# Patient Record
Sex: Male | Born: 1990 | Race: White | Hispanic: No | Marital: Single | Smoking: Current every day smoker
Health system: Southern US, Community
[De-identification: ages and names within clinical notes are randomized; demographics above are authoritative.]

## PROBLEM LIST (undated history)

## (undated) DIAGNOSIS — K279 Peptic ulcer, site unspecified, unspecified as acute or chronic, without hemorrhage or perforation: Secondary | ICD-10-CM

## (undated) DIAGNOSIS — K2970 Gastritis, unspecified, without bleeding: Secondary | ICD-10-CM

## (undated) HISTORY — PX: CHOLECYSTECTOMY: SHX55

---

## 2015-03-19 ENCOUNTER — Emergency Department (INDEPENDENT_AMBULATORY_CARE_PROVIDER_SITE_OTHER)
Admission: EM | Admit: 2015-03-19 | Discharge: 2015-03-19 | Disposition: A | Discharge status: Home / Self Care | Payer: Self-pay | Source: Home / Self Care | Attending: Emergency Medicine | Admitting: Emergency Medicine

## 2015-03-19 ENCOUNTER — Encounter (HOSPITAL_COMMUNITY): Payer: Self-pay | Admitting: Emergency Medicine

## 2015-03-19 DIAGNOSIS — H109 Unspecified conjunctivitis: Secondary | ICD-10-CM

## 2015-03-19 MED ORDER — POLYMYXIN B-TRIMETHOPRIM 10000-0.1 UNIT/ML-% OP SOLN: 1.0000 [drp] | OPHTHALMIC | Status: DC

## 2015-03-19 NOTE — ED Notes (Signed)
Patient c/o itchy eyes onset this morning. Patient reports that his daughter has had similar symptoms as well. Patient is in NAD.

## 2015-03-19 NOTE — Discharge Instructions (Signed)
Bacterial Conjunctivitis °Bacterial conjunctivitis (commonly called pink eye) is redness, soreness, or puffiness (inflammation) of the white part of your eye. It is caused by a germ called bacteria. These germs can easily spread from person to person (contagious). Your eye often will become red or pink. Your eye may also become irritated, watery, or have a thick discharge.  °HOME CARE  °· Apply a cool, clean washcloth over closed eyelids. Do this for 10-20 minutes, 3-4 times a day while you have pain. °· Gently wipe away any fluid coming from the eye with a warm, wet washcloth or cotton ball. °· Wash your hands often with soap and water. Use paper towels to dry your hands. °· Do not share towels or washcloths. °· Change or wash your pillowcase every day. °· Do not use eye makeup until the infection is gone. °· Do not use machines or drive if your vision is blurry. °· Stop using contact lenses. Do not use them again until your doctor says it is okay. °· Do not touch the tip of the eye drop bottle or medicine tube with your fingers when you put medicine on the eye. °GET HELP RIGHT AWAY IF:  °· Your eye is not better after 3 days of starting your medicine. °· You have a yellowish fluid coming out of the eye. °· You have more pain in the eye. °· Your eye redness is spreading. °· Your vision becomes blurry. °· You have a fever or lasting symptoms for more than 2-3 days. °· You have a fever and your symptoms suddenly get worse. °· You have pain in the face. °· Your face gets red or puffy (swollen). °MAKE SURE YOU:  °· Understand these instructions. °· Will watch this condition. °· Will get help right away if you are not doing well or get worse. °Document Released: 04/09/2008 Document Revised: 06/17/2012 Document Reviewed: 03/06/2012 °[employer]® Patient Information ©2015 [employer]. This information is not intended to replace advice given to you by your health care provider. Make sure you discuss any questions you have  with your health care provider. ° °

## 2015-03-19 NOTE — ED Provider Notes (Signed)
CSN: [id number]     Arrival date & time 03/19/15  1453 History   First MD Initiated Contact with Patient 03/19/15 1630     Chief Complaint  Patient presents with  . Eye Problem   (Consider location/radiation/quality/duration/timing/severity/associated sxs/prior Treatment) HPI  He is a 24 year old man here for evaluation of left eye pain. He states this started last night. He reports soreness around his left eye. He also reports increased watery drainage. No change in his vision. No fevers or chills. His 36-month-old daughter is also here with a eye issue. He denies any redness of the eye.  History reviewed. No pertinent past medical history. History reviewed. No pertinent past surgical history. No family history on file. Social History  Substance Use Topics  . Smoking status: Unknown If Ever Smoked  . Smokeless tobacco: None  . Alcohol Use: None    Review of Systems As in history of present illness Allergies  Review of patient's allergies indicates no known allergies.  Home Medications   Prior to Admission medications   Medication Sig Start Date End Date Taking? Authorizing Provider  trimethoprim-polymyxin b (POLYTRIM) ophthalmic solution Place 1 drop into the left eye every 4 (four) hours. For 5 days 03/19/15   Charm Rings, MD   Meds Ordered and Administered this Visit  Medications - No data to display  BP 115/67 mmHg  Temp(Src) 97.9 F (36.6 C) (Oral)  Resp 16  SpO2 97% No data found.   Physical Exam  Constitutional: He is oriented to person, place, and time. He appears well-developed and well-nourished. No distress.  Eyes: EOM are normal. Pupils are equal, round, and reactive to light.  Left conjunctiva is mildly erythematous. No swelling or edema of the eyelids.  Cardiovascular: Normal rate.   Pulmonary/Chest: Effort normal.  Neurological: He is alert and oriented to person, place, and time.    ED Course  Procedures (including critical care time)  Labs  Review Labs Reviewed - No data to display  Imaging Review No results found.   Visual Acuity Review  Right Eye Distance: 20/20 Left Eye Distance: 20/20 Bilateral Distance: 20/20    MDM   1. Conjunctivitis of left eye    We'll treat with Polytrim eyedrops. Follow-up as needed.    Charm Rings, MD 03/19/15 [phone]

## 2015-04-15 ENCOUNTER — Emergency Department (HOSPITAL_COMMUNITY)
Admission: EM | Admit: 2015-04-15 | Discharge: 2015-04-16 | Disposition: A | Discharge status: Home / Self Care | Payer: Self-pay | Attending: Emergency Medicine | Admitting: Emergency Medicine

## 2015-04-15 ENCOUNTER — Encounter (HOSPITAL_COMMUNITY): Payer: Self-pay | Admitting: Emergency Medicine

## 2015-04-15 DIAGNOSIS — R1013 Epigastric pain: Secondary | ICD-10-CM

## 2015-04-15 DIAGNOSIS — Z880 Allergy status to penicillin: Secondary | ICD-10-CM | POA: Insufficient documentation

## 2015-04-15 DIAGNOSIS — Z792 Long term (current) use of antibiotics: Secondary | ICD-10-CM | POA: Insufficient documentation

## 2015-04-15 DIAGNOSIS — Z9049 Acquired absence of other specified parts of digestive tract: Secondary | ICD-10-CM | POA: Insufficient documentation

## 2015-04-15 DIAGNOSIS — Y92238 Other place in hospital as the place of occurrence of the external cause: Secondary | ICD-10-CM | POA: Insufficient documentation

## 2015-04-15 DIAGNOSIS — S0990XA Unspecified injury of head, initial encounter: Secondary | ICD-10-CM

## 2015-04-15 DIAGNOSIS — Z8711 Personal history of peptic ulcer disease: Secondary | ICD-10-CM

## 2015-04-15 DIAGNOSIS — Z720 Tobacco use: Secondary | ICD-10-CM | POA: Insufficient documentation

## 2015-04-15 DIAGNOSIS — Y998 Other external cause status: Secondary | ICD-10-CM | POA: Insufficient documentation

## 2015-04-15 DIAGNOSIS — W2209XA Striking against other stationary object, initial encounter: Secondary | ICD-10-CM | POA: Insufficient documentation

## 2015-04-15 DIAGNOSIS — Y9389 Activity, other specified: Secondary | ICD-10-CM | POA: Insufficient documentation

## 2015-04-15 DIAGNOSIS — Z8719 Personal history of other diseases of the digestive system: Secondary | ICD-10-CM

## 2015-04-15 HISTORY — DX: Peptic ulcer, site unspecified, unspecified as acute or chronic, without hemorrhage or perforation: K27.9

## 2015-04-15 HISTORY — DX: Gastritis, unspecified, without bleeding: K29.70

## 2015-04-15 LAB — CBC WITH DIFFERENTIAL/PLATELET
Basophils Absolute: 0 10*3/uL (ref 0.0–0.1)
Basophils Relative: 0 %
EOS ABS: 0 10*3/uL (ref 0.0–0.7)
Eosinophils Relative: 0 %
HEMATOCRIT: 47.1 % (ref 39.0–52.0)
HEMOGLOBIN: 16.6 g/dL (ref 13.0–17.0)
LYMPHS ABS: 1.3 10*3/uL (ref 0.7–4.0)
Lymphocytes Relative: 9 %
MCH: 29.3 pg (ref 26.0–34.0)
MCHC: 35.2 g/dL (ref 30.0–36.0)
MCV: 83.2 fL (ref 78.0–100.0)
MONOS PCT: 7 %
Monocytes Absolute: 1 10*3/uL (ref 0.1–1.0)
NEUTROS PCT: 85 %
Neutro Abs: 12.7 10*3/uL — ABNORMAL HIGH (ref 1.7–7.7)
Platelets: 354 10*3/uL (ref 150–400)
RBC: 5.66 MIL/uL (ref 4.22–5.81)
RDW: 12.6 % (ref 11.5–15.5)
WBC: 15 10*3/uL — ABNORMAL HIGH (ref 4.0–10.5)

## 2015-04-15 MED ORDER — SODIUM CHLORIDE 0.9 % IV BOLUS (SEPSIS)
1000.0000 mL | Freq: Once | INTRAVENOUS | Status: AC
  Administered 2015-04-16: 1000 mL via INTRAVENOUS

## 2015-04-15 MED ORDER — GI COCKTAIL ~~LOC~~
30.0000 mL | Freq: Once | ORAL | Status: AC
Start: 2015-04-15 — End: 2015-04-16
  Administered 2015-04-16: 30 mL via ORAL
  Filled 2015-04-15: qty 30

## 2015-04-15 MED ORDER — PROMETHAZINE HCL 25 MG/ML IJ SOLN
12.5000 mg | Freq: Once | INTRAMUSCULAR | Status: AC
Start: 2015-04-15 — End: 2015-04-16
  Administered 2015-04-16: 12.5 mg via INTRAVENOUS
  Filled 2015-04-15: qty 1

## 2015-04-15 MED ORDER — PANTOPRAZOLE SODIUM 40 MG IV SOLR
40.0000 mg | Freq: Once | INTRAVENOUS | Status: AC
  Administered 2015-04-16: 40 mg via INTRAVENOUS
  Filled 2015-04-15: qty 40

## 2015-04-15 NOTE — ED Notes (Signed)
Pt placed in a gown and hooked up to the monitor with the BP cuff and pulse ox 

## 2015-04-15 NOTE — ED Notes (Signed)
Pt in EMS from home reporting epigastric pain, nausea, pt stated he passed out in shower and hit his head. Hx ulcers, smokes and ETOH use yesterday. Given 4 zofran en route

## 2015-04-15 NOTE — ED Provider Notes (Addendum)
CSN: [id number]     Arrival date & time 04/15/15  2300 History  By signing my name below, I, Terrance Branch, attest that this documentation has been prepared under the direction and in the presence of Geoffery Lyons, MD. Electronically Signed: Evon Slack, ED Scribe. 04/15/2015. 11:15 PM.    Chief Complaint  Patient presents with  . Abdominal Pain  . Nausea    Patient is a 24 y.o. male presenting with abdominal pain. The history is provided by the patient. No language interpreter was used.  Abdominal Pain Pain location:  Generalized Pain quality: stabbing   Duration:  2 days Chronicity:  Recurrent Associated symptoms: nausea and vomiting   Associated symptoms: no fever and no hematemesis    HPI Comments: Miguel Roman is a 24 y.o. male brought in by ambulance, who presents to the Emergency Department complaining of abdominal pain onset 2 days prior. Pt states that he has associated nausea and vomiting. Pt states that he usually finds relief with hot showers. Pt states that he has recently moved from [place], [place] and he forgot all his medications. Pt states that he was previously prescribed Phenergan, Protonix and benadryl. Per Ems pt has received Zofran PTA. Pt states that he has a Hx of peptic ulcers, gastritis and IBS. Pt states that the last time he has had a flare of symptoms was 5-6 month prior.     Past Medical History  Diagnosis Date  . Peptic ulcer   . Gastritis    Past Surgical History  Procedure Laterality Date  . Cholecystectomy     No family history on file. Social History  Substance Use Topics  . Smoking status: Current Every Day Smoker  . Smokeless tobacco: None  . Alcohol Use: Yes    Review of Systems  Constitutional: Negative for fever.  Gastrointestinal: Positive for nausea, vomiting and abdominal pain. Negative for hematemesis.  All other systems reviewed and are negative.     Allergies  Penicillins; Thorazine; Toradol; and Tramadol  Home  Medications   Prior to Admission medications   Medication Sig Start Date End Date Taking? Authorizing Provider  trimethoprim-polymyxin b (POLYTRIM) ophthalmic solution Place 1 drop into the left eye every 4 (four) hours. For 5 days 03/19/15   Charm Rings, MD   BP 154/92 mmHg  Pulse 106  Temp(Src) 98.2 F (36.8 C) (Oral)  Resp 18  SpO2 100%   Physical Exam  Constitutional: He is oriented to person, place, and time. He appears well-developed and well-nourished. No distress.  HENT:  Head: Normocephalic and atraumatic.  Eyes: Conjunctivae and EOM are normal.  Neck: Neck supple. No tracheal deviation present.  Cardiovascular: Normal rate, regular rhythm and normal heart sounds.   No murmur heard. Pulmonary/Chest: Effort normal and breath sounds normal. No respiratory distress. He has no wheezes. He has no rales.  Abdominal: There is tenderness. There is no rebound and no guarding.  TTP in epigastrium.   Musculoskeletal: Normal range of motion.  Neurological: He is alert and oriented to person, place, and time.  Skin: Skin is warm and dry.  Psychiatric: He has a normal mood and affect. His behavior is normal.  Nursing note and vitals reviewed.   ED Course  Procedures (including critical care time) DIAGNOSTIC STUDIES: Oxygen Saturation is 100% on RA, normal by my interpretation.    COORDINATION OF CARE: 11:14 PM-Discussed treatment plan with pt at bedside and pt agreed to plan.     Labs Review Labs Reviewed - No  data to display  Imaging Review No results found.    EKG Interpretation None      MDM   Final diagnoses:  None      Patient is a 24 year old male with stated history of gastric ulcers. He recently moved here from [place], [place]. He was treated there for this condition, and is out of his medications. He states that now he is experiencing additional abdominal pain. His workup today reveals no acute abnormality. He does have a mild elevation of  white count, however I am uncertain as to the significance of this. His CT scan is negative for acute process and he does not appear toxic.  He was given IV protonix, nausea medication, a GI cocktail, and I believe is appropriate for discharge. As he intends to be in the area for an extended period of time, I will give him the follow-up information for the on-call gastroenterology department with whom he can call to arrange an appointment to establish local care. I will prescribe Protonix and Phenergan.  He also complains of headache. He states that he passed out at home and hit his head in the shower. He is complaining of headache. He also reports hitting his head on the bed rail in the emergency department. A CT scan of his head was also obtained which was negative. He is neurologically intact.   I personally performed the services described in this documentation, which was scribed in my presence. The recorded information has been reviewed and is accurate.      Geoffery Lyons, MD 04/16/15 1610  Geoffery Lyons, MD 04/16/15 [id number]

## 2015-04-15 NOTE — ED Notes (Signed)
Pt called out, stated he "passed out" and "hit his head on the railing".  This RN went in to check on him, pt is anxious and states he is feeling numb all over and "this happens when I get like this".  This RN will update primary RN and provide pt with medications.

## 2015-04-16 ENCOUNTER — Encounter (HOSPITAL_COMMUNITY): Payer: Self-pay | Admitting: *Deleted

## 2015-04-16 ENCOUNTER — Emergency Department (HOSPITAL_COMMUNITY): Payer: Self-pay

## 2015-04-16 ENCOUNTER — Encounter (HOSPITAL_COMMUNITY): Payer: Self-pay | Admitting: Radiology

## 2015-04-16 ENCOUNTER — Emergency Department (HOSPITAL_COMMUNITY)
Admission: EM | Admit: 2015-04-16 | Discharge: 2015-04-16 | Disposition: A | Discharge status: Home / Self Care | Payer: Self-pay | Attending: Emergency Medicine | Admitting: Emergency Medicine

## 2015-04-16 DIAGNOSIS — R112 Nausea with vomiting, unspecified: Secondary | ICD-10-CM | POA: Insufficient documentation

## 2015-04-16 DIAGNOSIS — R1084 Generalized abdominal pain: Secondary | ICD-10-CM | POA: Insufficient documentation

## 2015-04-16 DIAGNOSIS — Z8719 Personal history of other diseases of the digestive system: Secondary | ICD-10-CM | POA: Insufficient documentation

## 2015-04-16 DIAGNOSIS — R109 Unspecified abdominal pain: Secondary | ICD-10-CM

## 2015-04-16 DIAGNOSIS — Z8711 Personal history of peptic ulcer disease: Secondary | ICD-10-CM | POA: Insufficient documentation

## 2015-04-16 DIAGNOSIS — Z720 Tobacco use: Secondary | ICD-10-CM | POA: Insufficient documentation

## 2015-04-16 DIAGNOSIS — Z880 Allergy status to penicillin: Secondary | ICD-10-CM | POA: Insufficient documentation

## 2015-04-16 LAB — CBC
HCT: 42.2 % (ref 39.0–52.0)
HEMOGLOBIN: 14.8 g/dL (ref 13.0–17.0)
MCH: 29.1 pg (ref 26.0–34.0)
MCHC: 35.1 g/dL (ref 30.0–36.0)
MCV: 83.1 fL (ref 78.0–100.0)
PLATELETS: 311 10*3/uL (ref 150–400)
RBC: 5.08 MIL/uL (ref 4.22–5.81)
RDW: 12.7 % (ref 11.5–15.5)
WBC: 12.3 10*3/uL — ABNORMAL HIGH (ref 4.0–10.5)

## 2015-04-16 LAB — COMPREHENSIVE METABOLIC PANEL
ALBUMIN: 5.1 g/dL — AB (ref 3.5–5.0)
ALBUMIN: 4.7 g/dL (ref 3.5–5.0)
ALK PHOS: 75 U/L (ref 38–126)
ALK PHOS: 59 U/L (ref 38–126)
ALT: 22 U/L (ref 17–63)
ALT: 21 U/L (ref 17–63)
ANION GAP: 11 (ref 5–15)
AST: 31 U/L (ref 15–41)
AST: 36 U/L (ref 15–41)
Anion gap: 15 (ref 5–15)
BILIRUBIN TOTAL: 0.9 mg/dL (ref 0.3–1.2)
BILIRUBIN TOTAL: 1.2 mg/dL (ref 0.3–1.2)
BUN: 25 mg/dL — AB (ref 6–20)
BUN: 21 mg/dL — AB (ref 6–20)
CALCIUM: 9 mg/dL (ref 8.9–10.3)
CO2: 20 mmol/L — ABNORMAL LOW (ref 22–32)
CO2: 21 mmol/L — ABNORMAL LOW (ref 22–32)
CREATININE: 0.98 mg/dL (ref 0.61–1.24)
Calcium: 10.2 mg/dL (ref 8.9–10.3)
Chloride: 106 mmol/L (ref 101–111)
Chloride: 103 mmol/L (ref 101–111)
Creatinine, Ser: 1.43 mg/dL — ABNORMAL HIGH (ref 0.61–1.24)
GFR calc Af Amer: >60 mL/min (ref >60)
GFR calc Af Amer: >60 mL/min (ref >60)
GFR calc non Af Amer: >60 mL/min (ref >60)
GFR calc non Af Amer: >60 mL/min (ref >60)
GLUCOSE: 156 mg/dL — AB (ref 65–99)
GLUCOSE: 114 mg/dL — AB (ref 65–99)
POTASSIUM: 3.9 mmol/L (ref 3.5–5.1)
Potassium: 3.4 mmol/L — ABNORMAL LOW (ref 3.5–5.1)
Sodium: 141 mmol/L (ref 135–145)
Sodium: 135 mmol/L (ref 135–145)
TOTAL PROTEIN: 8.9 g/dL — AB (ref 6.5–8.1)
TOTAL PROTEIN: 8 g/dL (ref 6.5–8.1)

## 2015-04-16 LAB — URINALYSIS, ROUTINE W REFLEX MICROSCOPIC
Glucose, UA: NEGATIVE mg/dL
HGB URINE DIPSTICK: NEGATIVE
Ketones, ur: >80 mg/dL — AB
Leukocytes, UA: NEGATIVE
Nitrite: NEGATIVE
PROTEIN: 30 mg/dL — AB
Specific Gravity, Urine: 1.038 — ABNORMAL HIGH (ref 1.005–1.030)
UROBILINOGEN UA: 1 mg/dL (ref 0.0–1.0)
pH: 6.5 (ref 5.0–8.0)

## 2015-04-16 LAB — LIPASE, BLOOD
Lipase: 15 U/L — ABNORMAL LOW (ref 22–51)
Lipase: 19 U/L — ABNORMAL LOW (ref 22–51)

## 2015-04-16 LAB — URINE MICROSCOPIC-ADD ON

## 2015-04-16 MED ORDER — PROMETHAZINE HCL 25 MG PO TABS: 25.0000 mg | ORAL_TABLET | Freq: Four times a day (QID) | ORAL | Status: DC | PRN

## 2015-04-16 MED ORDER — SODIUM CHLORIDE 0.9 % IV SOLN
1000.0000 mL | Freq: Once | INTRAVENOUS | Status: AC
  Administered 2015-04-16: 1000 mL via INTRAVENOUS

## 2015-04-16 MED ORDER — GI COCKTAIL ~~LOC~~
30.0000 mL | Freq: Once | ORAL | Status: AC
  Administered 2015-04-16: 30 mL via ORAL
  Filled 2015-04-16: qty 30

## 2015-04-16 MED ORDER — IOHEXOL 300 MG/ML  SOLN
100.0000 mL | Freq: Once | INTRAMUSCULAR | Status: AC | PRN
  Administered 2015-04-16: 100 mL via INTRAVENOUS

## 2015-04-16 MED ORDER — PANTOPRAZOLE SODIUM 20 MG PO TBEC: 20.0000 mg | DELAYED_RELEASE_TABLET | Freq: Every day | ORAL | Status: DC

## 2015-04-16 MED ORDER — PROMETHAZINE HCL 25 MG/ML IJ SOLN
25.0000 mg | Freq: Once | INTRAMUSCULAR | Status: AC
Start: 2015-04-16 — End: 2015-04-16
  Administered 2015-04-16: 25 mg via INTRAVENOUS
  Filled 2015-04-16: qty 1

## 2015-04-16 NOTE — ED Notes (Signed)
Pt to CT

## 2015-04-16 NOTE — Discharge Instructions (Signed)

## 2015-04-16 NOTE — ED Notes (Signed)
Pt states "I just moved up here about a month a half ago.  I went to [facility] yesterday and they gave me protonix and zofran Rx's but I didn't have the money to get them filled.  I get paid today.  I work at [employer]."

## 2015-04-16 NOTE — Discharge Instructions (Signed)
Protonix as prescribed.  Phenergan as prescribed as needed for nausea.  Call [facility] gastroenterology to arrange a follow-up appointment. The contact information for [facility] has been provided in this discharge summary.   Abdominal Pain Many things can cause abdominal pain. Usually, abdominal pain is not caused by a disease and will improve without treatment. It can often be observed and treated at home. Your health care provider will do a physical exam and possibly order blood tests and X-rays to help determine the seriousness of your pain. However, in many cases, more time must pass before a clear cause of the pain can be found. Before that point, your health care provider may not know if you need more testing or further treatment. HOME CARE INSTRUCTIONS  Monitor your abdominal pain for any changes. The following actions may help to alleviate any discomfort you are experiencing:  Only take over-the-counter or prescription medicines as directed by your health care provider.  Do not take laxatives unless directed to do so by your health care provider.  Try a clear liquid diet (broth, tea, or water) as directed by your health care provider. Slowly move to a bland diet as tolerated. SEEK MEDICAL CARE IF:  You have unexplained abdominal pain.  You have abdominal pain associated with nausea or diarrhea.  You have pain when you urinate or have a bowel movement.  You experience abdominal pain that wakes you in the night.  You have abdominal pain that is worsened or improved by eating food.  You have abdominal pain that is worsened with eating fatty foods.  You have a fever. SEEK IMMEDIATE MEDICAL CARE IF:   Your pain does not go away within 2 hours.  You keep throwing up (vomiting).  Your pain is felt only in portions of the abdomen, such as the right side or the left lower portion of the abdomen.  You pass bloody or black tarry stools. MAKE SURE YOU:  Understand these  instructions.   Will watch your condition.   Will get help right away if you are not doing well or get worse.  Document Released: 04/10/2005 Document Revised: 07/06/2013 Document Reviewed: 03/10/2013 [facility] Patient Information 2015 [place], [place]. This information is not intended to replace advice given to you by your health care provider. Make sure you discuss any questions you have with your health care provider.  Concussion A concussion, or closed-head injury, is a brain injury caused by a direct blow to the head or by a quick and sudden movement (jolt) of the head or neck. Concussions are usually not life-threatening. Even so, the effects of a concussion can be serious. If you have had a concussion before, you are more likely to experience concussion-like symptoms after a direct blow to the head.  CAUSES  Direct blow to the head, such as from running into another player during a soccer game, being hit in a fight, or hitting your head on a hard surface.  A jolt of the head or neck that causes the brain to move back and forth inside the skull, such as in a car crash. SIGNS AND SYMPTOMS The signs of a concussion can be hard to notice. Early on, they may be missed by you, family members, and health care providers. You may look fine but act or feel differently. Symptoms are usually temporary, but they may last for days, weeks, or even longer. Some symptoms may appear right away while others may not show up for hours or days. Every head  injury is different. Symptoms include:  Mild to moderate headaches that will not go away.  A feeling of pressure inside your head.  Having more trouble than usual:  Learning or remembering things you have heard.  Answering questions.  Paying attention or concentrating.  Organizing daily tasks.  Making decisions and solving problems.  Slowness in thinking, acting or reacting, speaking, or reading.  Getting lost or being easily  confused.  Feeling tired all the time or lacking energy (fatigued).  Feeling drowsy.  Sleep disturbances.  Sleeping more than usual.  Sleeping less than usual.  Trouble falling asleep.  Trouble sleeping (insomnia).  Loss of balance or feeling lightheaded or dizzy.  Nausea or vomiting.  Numbness or tingling.  Increased sensitivity to:  Sounds.  Lights.  Distractions.  Vision problems or eyes that tire easily.  Diminished sense of taste or smell.  Ringing in the ears.  Mood changes such as feeling sad or anxious.  Becoming easily irritated or angry for little or no reason.  Lack of motivation.  Seeing or hearing things other people do not see or hear (hallucinations). DIAGNOSIS Your health care provider can usually diagnose a concussion based on a description of your injury and symptoms. He or she will ask whether you passed out (lost consciousness) and whether you are having trouble remembering events that happened right before and during your injury. Your evaluation might include:  A brain scan to look for signs of injury to the brain. Even if the test shows no injury, you may still have a concussion.  Blood tests to be sure other problems are not present. TREATMENT  Concussions are usually treated in an emergency department, in urgent care, or at a clinic. You may need to stay in the hospital overnight for further treatment.  Tell your health care provider if you are taking any medicines, including prescription medicines, over-the-counter medicines, and natural remedies. Some medicines, such as blood thinners (anticoagulants) and aspirin, may increase the chance of complications. Also tell your health care provider whether you have had alcohol or are taking illegal drugs. This information may affect treatment.  Your health care provider will send you home with important instructions to follow.  How fast you will recover from a concussion depends on many  factors. These factors include how severe your concussion is, what part of your brain was injured, your age, and how healthy you were before the concussion.  Most people with mild injuries recover fully. Recovery can take time. In general, recovery is slower in older persons. Also, persons who have had a concussion in the past or have other medical problems may find that it takes longer to recover from their current injury. HOME CARE INSTRUCTIONS General Instructions  Carefully follow the directions your health care provider gave you.  Only take over-the-counter or prescription medicines for pain, discomfort, or fever as directed by your health care provider.  Take only those medicines that your health care provider has approved.  Do not drink alcohol until your health care provider says you are well enough to do so. Alcohol and certain other drugs may slow your recovery and can put you at risk of further injury.  If it is harder than usual to remember things, write them down.  If you are easily distracted, try to do one thing at a time. For example, do not try to watch TV while fixing dinner.  Talk with family members or close friends when making important decisions.  Keep all follow-up  appointments. Repeated evaluation of your symptoms is recommended for your recovery.  Watch your symptoms and tell others to do the same. Complications sometimes occur after a concussion. Older adults with a brain injury may have a higher risk of serious complications, such as a blood clot on the brain.  Tell your teachers, school nurse, school counselor, coach, athletic trainer, or work Production designer, theatre/television/film about your injury, symptoms, and restrictions. Tell them about what you can or cannot do. They should watch for:  Increased problems with attention or concentration.  Increased difficulty remembering or learning new information.  Increased time needed to complete tasks or assignments.  Increased irritability  or decreased ability to cope with stress.  Increased symptoms.  Rest. Rest helps the brain to heal. Make sure you:  Get plenty of sleep at night. Avoid staying up late at night.  Keep the same bedtime hours on weekends and weekdays.  Rest during the day. Take daytime naps or rest breaks when you feel tired.  Limit activities that require a lot of thought or concentration. These include:  Doing homework or job-related work.  Watching TV.  Working on the computer.  Avoid any situation where there is potential for another head injury (football, hockey, soccer, basketball, martial arts, downhill snow sports and horseback riding). Your condition will get worse every time you experience a concussion. You should avoid these activities until you are evaluated by the appropriate follow-up health care providers. Returning To Your Regular Activities You will need to return to your normal activities slowly, not all at once. You must give your body and brain enough time for recovery.  Do not return to sports or other athletic activities until your health care provider tells you it is safe to do so.  Ask your health care provider when you can drive, ride a bicycle, or operate heavy machinery. Your ability to react may be slower after a brain injury. Never do these activities if you are dizzy.  Ask your health care provider about when you can return to work or school. Preventing Another Concussion It is very important to avoid another brain injury, especially before you have recovered. In rare cases, another injury can lead to permanent brain damage, brain swelling, or death. The risk of this is greatest during the first 7-10 days after a head injury. Avoid injuries by:  Wearing a seat belt when riding in a car.  Drinking alcohol only in moderation.  Wearing a helmet when biking, skiing, skateboarding, skating, or doing similar activities.  Avoiding activities that could lead to a second  concussion, such as contact or recreational sports, until your health care provider says it is okay.  Taking safety measures in your home.  Remove clutter and tripping hazards from floors and stairways.  Use grab bars in bathrooms and handrails by stairs.  Place non-slip mats on floors and in bathtubs.  Improve lighting in dim areas. SEEK MEDICAL CARE IF:  You have increased problems paying attention or concentrating.  You have increased difficulty remembering or learning new information.  You need more time to complete tasks or assignments than before.  You have increased irritability or decreased ability to cope with stress.  You have more symptoms than before. Seek medical care if you have any of the following symptoms for more than 2 weeks after your injury:  Lasting (chronic) headaches.  Dizziness or balance problems.  Nausea.  Vision problems.  Increased sensitivity to noise or light.  Depression or mood swings.  Anxiety  or irritability.  Memory problems.  Difficulty concentrating or paying attention.  Sleep problems.  Feeling tired all the time. SEEK IMMEDIATE MEDICAL CARE IF:  You have severe or worsening headaches. These may be a sign of a blood clot in the brain.  You have weakness (even if only in one hand, leg, or part of the face).  You have numbness.  You have decreased coordination.  You vomit repeatedly.  You have increased sleepiness.  One pupil is larger than the other.  You have convulsions.  You have slurred speech.  You have increased confusion. This may be a sign of a blood clot in the brain.  You have increased restlessness, agitation, or irritability.  You are unable to recognize people or places.  You have neck pain.  It is difficult to wake you up.  You have unusual behavior changes.  You lose consciousness. MAKE SURE YOU:  Understand these instructions.  Will watch your condition.  Will get help right away  if you are not doing well or get worse. Document Released: 09/21/2003 Document Revised: 07/06/2013 Document Reviewed: 01/21/2013 [facility] Patient Information 2015 [place], [place]. This information is not intended to replace advice given to you by your health care provider. Make sure you discuss any questions you have with your health care provider.

## 2015-04-16 NOTE — ED Notes (Signed)
Pt still unable to void

## 2015-04-16 NOTE — ED Notes (Signed)
Pt unable to void at this time. 

## 2015-04-16 NOTE — ED Notes (Signed)
Pt escorted to lobby.  Able to walk independently.  Given water and shown to the bathroom.

## 2015-04-16 NOTE — ED Provider Notes (Signed)
CSN: [id number]     Arrival date & time 04/16/15  1337 History   First MD Initiated Contact with Patient 04/16/15 1436     Chief Complaint  Patient presents with  . Abdominal Pain  . Nausea   HPI Patient has a history of recurrent abdominal pain near the bowel syndrome. Patient states he started having a recurrent episode of abdominal pain and vomiting over the last few days. The pain is sharp and stabbing in his upper abdomen. He has not been able to eat or drink much because of the pain and the vomiting. Patient went to [facility] emergency room yesterday. He was evaluated in the emergency room with laboratory tests as well as a CT scan. Tests showed a slight increase in his white blood cell count but otherwise was unremarkable. Patient was treated and released. He was given prescriptions. Patient returns to the emergency room today because he still feeling poorly. He did not get the prescriptions filled.  No fevers.  No diarrhea. Past Medical History  Diagnosis Date  . Peptic ulcer   . Gastritis    Past Surgical History  Procedure Laterality Date  . Cholecystectomy     No family history on file. Social History  Substance Use Topics  . Smoking status: Current Every Day Smoker -- 1.00 packs/day  . Smokeless tobacco: None  . Alcohol Use: Yes     Comment: Pt states "I ain't drunk in 2 weeks.  I don't drink every day."    Review of Systems    Allergies  Haldol; Thorazine; Toradol; Tramadol; Trazodone and nefazodone; and Penicillins  Home Medications   Prior to Admission medications   Medication Sig Start Date End Date Taking? Authorizing Provider  pantoprazole (PROTONIX) 20 MG tablet Take 1 tablet (20 mg total) by mouth daily. 04/16/15  Yes Geoffery Lyons, MD  promethazine (PHENERGAN) 25 MG tablet Take 1 tablet (25 mg total) by mouth every 6 (six) hours as needed for nausea. 04/16/15  Yes Geoffery Lyons, MD   BP 153/93 mmHg  Pulse 88  Temp(Src) 98.5 F (36.9 C) (Oral)  Resp 18   SpO2 100% Physical Exam  Constitutional: He appears distressed.  HENT:  Head: Normocephalic and atraumatic.  Right Ear: External ear normal.  Left Ear: External ear normal.  Eyes: Conjunctivae are normal. Right eye exhibits no discharge. Left eye exhibits no discharge. No scleral icterus.  Neck: Neck supple. No tracheal deviation present.  Cardiovascular: Normal rate, regular rhythm and intact distal pulses.   Pulmonary/Chest: Effort normal and breath sounds normal. No stridor. No respiratory distress. He has no wheezes. He has no rales.  Abdominal: Soft. Bowel sounds are normal. He exhibits no distension. There is generalized tenderness. There is no rebound and no guarding.  Musculoskeletal: He exhibits no edema or tenderness.  Neurological: He is alert. He has normal strength. No cranial nerve deficit (no facial droop, extraocular movements intact, no slurred speech) or sensory deficit. He exhibits normal muscle tone. He displays no seizure activity. Coordination normal.  Skin: Skin is warm and dry. No rash noted. He is not diaphoretic.  Psychiatric: He has a normal mood and affect.  Nursing note and vitals reviewed.   ED Course  Procedures (including critical care time) Labs Review Labs Reviewed  CBC - Abnormal; Notable for the following:    WBC 12.3 (*)    All other components within normal limits  LIPASE, BLOOD  COMPREHENSIVE METABOLIC PANEL  URINALYSIS, ROUTINE W REFLEX MICROSCOPIC (NOT AT [facility])  Imaging Review Ct Head Wo Contrast  04/16/2015   CLINICAL DATA:  Syncope with head injury on bathtub. Initial encounter.  EXAM: CT HEAD WITHOUT CONTRAST  TECHNIQUE: Contiguous axial images were obtained from the base of the skull through the vertex without intravenous contrast.  COMPARISON:  None.  FINDINGS: Skull and Sinuses:Remote appearing nasal arch fractures with depression.  Scar or laceration in the left posterior scalp near the vertex. No underlying fracture.  Adenoid  hypertrophy appearance.  Orbits: No acute abnormality.  Brain: Normal. No evidence of acute infarction, hemorrhage, hydrocephalus, or mass lesion/mass effect.  IMPRESSION: 1. Normal intracranial imaging. 2. Remote appearing depressed nasal arch fractures.   Electronically Signed   By: Marnee Spring M.D.   On: 04/16/2015 00:44   Ct Abdomen Pelvis W Contrast  04/16/2015   CLINICAL DATA:  Acute onset of mid abdominal pain, nausea, vomiting and diarrhea. Initial encounter.  EXAM: CT ABDOMEN AND PELVIS WITH CONTRAST  TECHNIQUE: Multidetector CT imaging of the abdomen and pelvis was performed using the standard protocol following bolus administration of intravenous contrast.  CONTRAST:  OMNIPAQUE IOHEXOL 300 MG/ML  SOLN  COMPARISON:  None.  FINDINGS: The visualized lung bases are clear.  The liver and spleen are unremarkable in appearance. The patient is status post cholecystectomy, with clips noted at the gallbladder fossa. The pancreas and adrenal glands are unremarkable.  The kidneys are unremarkable in appearance. There is no evidence of hydronephrosis. No renal or ureteral stones are seen. No perinephric stranding is appreciated.  No free fluid is identified. The small bowel is unremarkable in appearance. The stomach is within normal limits. No acute vascular abnormalities are seen.  The appendix is normal in caliber, without evidence of appendicitis. The colon is unremarkable in appearance.  The bladder is mildly distended and grossly unremarkable. Mild calcification is noted along a small urachal remnant, without a focal mass. The prostate remains normal in size. No inguinal lymphadenopathy is seen.  No acute osseous abnormalities are identified.  IMPRESSION: Unremarkable contrast-enhanced CT of the abdomen and pelvis.   Electronically Signed   By: Roanna Raider M.D.   On: 04/16/2015 00:45   I have personally reviewed and evaluated these images and lab results as part of my medical  decision-making.  Medications  0.9 %  sodium chloride infusion (0 mLs Intravenous Stopped 04/16/15 1617)  gi cocktail (Maalox,Lidocaine,Donnatal) (30 mLs Oral Given 04/16/15 1502)  promethazine (PHENERGAN) injection 25 mg (25 mg Intravenous Given 04/16/15 1456)     MDM   Will give iv fluids and antinausea medications.  Recheck labs.  CT scan yesterday did not show any acute process.  Pt states he does have a history of recurrent abdominal pain with IBS and ulcers.   Linwood Dibbles, MD 04/16/15 [phone]

## 2015-04-17 ENCOUNTER — Emergency Department (HOSPITAL_COMMUNITY)
Admission: EM | Admit: 2015-04-17 | Discharge: 2015-04-17 | Discharge status: Against Medical Advice | Payer: Self-pay | Attending: Emergency Medicine | Admitting: Emergency Medicine

## 2015-04-17 ENCOUNTER — Emergency Department (HOSPITAL_COMMUNITY): Payer: Self-pay

## 2015-04-17 ENCOUNTER — Emergency Department (HOSPITAL_COMMUNITY)
Admission: EM | Admit: 2015-04-17 | Discharge: 2015-04-17 | Disposition: A | Discharge status: Home / Self Care | Payer: Self-pay | Attending: Emergency Medicine | Admitting: Emergency Medicine

## 2015-04-17 ENCOUNTER — Encounter (HOSPITAL_COMMUNITY): Payer: Self-pay | Admitting: Emergency Medicine

## 2015-04-17 DIAGNOSIS — Z8719 Personal history of other diseases of the digestive system: Secondary | ICD-10-CM | POA: Insufficient documentation

## 2015-04-17 DIAGNOSIS — R112 Nausea with vomiting, unspecified: Secondary | ICD-10-CM | POA: Insufficient documentation

## 2015-04-17 DIAGNOSIS — R1013 Epigastric pain: Secondary | ICD-10-CM | POA: Insufficient documentation

## 2015-04-17 DIAGNOSIS — R1030 Lower abdominal pain, unspecified: Secondary | ICD-10-CM | POA: Insufficient documentation

## 2015-04-17 DIAGNOSIS — Z720 Tobacco use: Secondary | ICD-10-CM | POA: Insufficient documentation

## 2015-04-17 DIAGNOSIS — Z880 Allergy status to penicillin: Secondary | ICD-10-CM | POA: Insufficient documentation

## 2015-04-17 DIAGNOSIS — Z9049 Acquired absence of other specified parts of digestive tract: Secondary | ICD-10-CM | POA: Insufficient documentation

## 2015-04-17 DIAGNOSIS — Z79899 Other long term (current) drug therapy: Secondary | ICD-10-CM | POA: Insufficient documentation

## 2015-04-17 DIAGNOSIS — Z8711 Personal history of peptic ulcer disease: Secondary | ICD-10-CM | POA: Insufficient documentation

## 2015-04-17 DIAGNOSIS — R1012 Left upper quadrant pain: Secondary | ICD-10-CM | POA: Insufficient documentation

## 2015-04-17 LAB — CBC WITH DIFFERENTIAL/PLATELET
BASOS ABS: 0 10*3/uL (ref 0.0–0.1)
BASOS PCT: 0 %
EOS PCT: 0 %
Eosinophils Absolute: 0 10*3/uL (ref 0.0–0.7)
HEMATOCRIT: 41.2 % (ref 39.0–52.0)
Hemoglobin: 14.4 g/dL (ref 13.0–17.0)
LYMPHS PCT: 7 %
Lymphs Abs: 0.9 10*3/uL (ref 0.7–4.0)
MCH: 29.3 pg (ref 26.0–34.0)
MCHC: 35 g/dL (ref 30.0–36.0)
MCV: 83.7 fL (ref 78.0–100.0)
Monocytes Absolute: 0.4 10*3/uL (ref 0.1–1.0)
Monocytes Relative: 3 %
NEUTROS ABS: 11.8 10*3/uL — AB (ref 1.7–7.7)
Neutrophils Relative %: 90 %
PLATELETS: 301 10*3/uL (ref 150–400)
RBC: 4.92 MIL/uL (ref 4.22–5.81)
RDW: 12.6 % (ref 11.5–15.5)
WBC: 13.1 10*3/uL — AB (ref 4.0–10.5)

## 2015-04-17 LAB — COMPREHENSIVE METABOLIC PANEL
ALT: 24 U/L (ref 17–63)
ANION GAP: 9 (ref 5–15)
AST: 51 U/L — ABNORMAL HIGH (ref 15–41)
Albumin: 4.5 g/dL (ref 3.5–5.0)
Alkaline Phosphatase: 63 U/L (ref 38–126)
BUN: 20 mg/dL (ref 6–20)
CHLORIDE: 106 mmol/L (ref 101–111)
CO2: 23 mmol/L (ref 22–32)
Calcium: 8.9 mg/dL (ref 8.9–10.3)
Creatinine, Ser: 0.99 mg/dL (ref 0.61–1.24)
Glucose, Bld: 111 mg/dL — ABNORMAL HIGH (ref 65–99)
POTASSIUM: 3.3 mmol/L — AB (ref 3.5–5.1)
Sodium: 138 mmol/L (ref 135–145)
TOTAL PROTEIN: 8 g/dL (ref 6.5–8.1)
Total Bilirubin: 0.9 mg/dL (ref 0.3–1.2)

## 2015-04-17 LAB — LIPASE, BLOOD: LIPASE: 24 U/L (ref 22–51)

## 2015-04-17 LAB — URINALYSIS, ROUTINE W REFLEX MICROSCOPIC
Bilirubin Urine: NEGATIVE
Glucose, UA: NEGATIVE mg/dL
Hgb urine dipstick: NEGATIVE
Ketones, ur: >80 mg/dL — AB
LEUKOCYTES UA: NEGATIVE
Nitrite: NEGATIVE
PROTEIN: NEGATIVE mg/dL
Specific Gravity, Urine: 1.03 (ref 1.005–1.030)
UROBILINOGEN UA: 1 mg/dL (ref 0.0–1.0)
pH: 7 (ref 5.0–8.0)

## 2015-04-17 MED ORDER — PROMETHAZINE HCL 25 MG/ML IJ SOLN
25.0000 mg | Freq: Once | INTRAMUSCULAR | Status: AC
  Administered 2015-04-17: 25 mg via INTRAVENOUS
  Filled 2015-04-17: qty 1

## 2015-04-17 MED ORDER — METOCLOPRAMIDE HCL 5 MG/ML IJ SOLN: 10.0000 mg | Freq: Once | INTRAMUSCULAR | Status: DC

## 2015-04-17 MED ORDER — PROMETHAZINE HCL 25 MG RE SUPP: 25.0000 mg | Freq: Four times a day (QID) | RECTAL | Status: DC | PRN

## 2015-04-17 MED ORDER — METOCLOPRAMIDE HCL 5 MG/ML IJ SOLN
10.0000 mg | Freq: Once | INTRAMUSCULAR | Status: AC
  Administered 2015-04-17: 10 mg via INTRAVENOUS
  Filled 2015-04-17: qty 2

## 2015-04-17 MED ORDER — SODIUM CHLORIDE 0.9 % IV BOLUS (SEPSIS)
1000.0000 mL | Freq: Once | INTRAVENOUS | Status: AC
  Administered 2015-04-17: 1000 mL via INTRAVENOUS

## 2015-04-17 MED ORDER — FAMOTIDINE IN NACL 20-0.9 MG/50ML-% IV SOLN
20.0000 mg | INTRAVENOUS | Status: AC
  Administered 2015-04-17: 20 mg via INTRAVENOUS
  Filled 2015-04-17: qty 50

## 2015-04-17 MED ORDER — PROMETHAZINE HCL 25 MG PO TABS: 25.0000 mg | ORAL_TABLET | Freq: Four times a day (QID) | ORAL | Status: DC | PRN

## 2015-04-17 MED ORDER — GI COCKTAIL ~~LOC~~
30.0000 mL | Freq: Once | ORAL | Status: AC
  Administered 2015-04-17: 30 mL via ORAL
  Filled 2015-04-17: qty 30

## 2015-04-17 MED ORDER — ONDANSETRON HCL 4 MG/2ML IJ SOLN
4.0000 mg | Freq: Once | INTRAMUSCULAR | Status: AC
  Administered 2015-04-17: 4 mg via INTRAVENOUS
  Filled 2015-04-17: qty 2

## 2015-04-17 MED ORDER — PANTOPRAZOLE SODIUM 40 MG IV SOLR
40.0000 mg | INTRAVENOUS | Status: AC
  Administered 2015-04-17: 40 mg via INTRAVENOUS
  Filled 2015-04-17: qty 40

## 2015-04-17 MED ORDER — FAMOTIDINE 20 MG PO TABS: 20.0000 mg | ORAL_TABLET | Freq: Two times a day (BID) | ORAL | Status: DC

## 2015-04-17 NOTE — ED Notes (Signed)
Pt walking around stating that he was "going to pass out" to "get someone's attention".  Pt assisted back to chair.  Complaining that he is not in a regular bed.

## 2015-04-17 NOTE — ED Notes (Signed)
Per EMS: Pt states he went to [facility] on Friday.  Was prescribed phenergan and protonix but has not gotten them filled.  Was also here yesterday for same.  NV and gen abd pain.

## 2015-04-17 NOTE — ED Notes (Signed)
Pt given urinal.

## 2015-04-17 NOTE — ED Notes (Signed)
Pt's mother contacted this Consulting civil engineer, due to being upset with the Pt's care.  Pt's mother was informed that the Pt had an [place] and lab work completed and has been given several medications.  No results were provided.  Pt's mother questioned admission status and the possibility of an EGD.  She was informed that the Pt would have to meet certain criteria for admission and EGDs are only performed on an emergent basis.  She was not happy with this information and stated "I have the CEO for the whole hospital's number, but I don't want to make that call.  He is pain and they are treating him badly.  That's not how you treat people."  This Charge RN apologized that he is in pain and feels that he is being treated badly.  Pt's mother offered [place] voicemail, which she accepted.

## 2015-04-17 NOTE — Discharge Instructions (Signed)

## 2015-04-17 NOTE — ED Notes (Signed)
Pt reports wife was on the phone and wanting to talk to nurse, "wife" said "why are ya'll not treating him and treating his pain?" rn explained that we are treating the pt, wife interrupted me and said "ya'll are not and if I have to call the CEO I will", rn stated that she was not going to continue talking to wife if she was going to threaten nurse, wife started talking in louder voice and nurse just gave phone back to pt.   rn gave pt another urinal. Pt asking "when he can get some more pain medicine".

## 2015-04-17 NOTE — ED Notes (Signed)
Pt stated that zofran "makes him nauseas", rn explained to pt that zofran is for nausea, pt keeps asking where he phenergan is, rn explained it must come from pharmacy.   Pt writhing in bed, pt stopped writhing in bed 30 seconds after phenergan was given. Pt states "in [place] I would stay in the hospital for 6 days with abdominal pain".

## 2015-04-17 NOTE — ED Notes (Signed)
rn went into room to admister medication, pt said "my mom is crazy, my girlfriend called my mom and my mom is going to call the CEO".   Pt said "my girl friend wants to know why I couldn't have visitors". rn explained that visitors asked to come back when pt was getting [place], and reminded pt that rn told him that his girlfriend had relayed the message that she was leaving and to call her when he was leaving.   Pt said in [place] " I would be admitted for this and get protonix, diluadid or morphine, and phenergan". rn explained that pt had received protonix and phenergan.

## 2015-04-17 NOTE — ED Notes (Signed)
[place] at bedside, registration asked if visitors could come back, rn said no because [place] was in the room. Registration relayed that girlfriend wanted pt to know she was leaving and to let her know when he was leaving. This was relayed to pt, [place] tech was witness.

## 2015-04-17 NOTE — ED Notes (Signed)
Per EMS: Pt just left AMA from [facility].  Called 911 from [employer].  Pt attempting to hang off the side of the stretcher upon riding to triage.  Pt is A&O x 4, moaning.  20 g in lt AC.

## 2015-04-17 NOTE — ED Notes (Signed)
Pt given applesauce to eat per md, pt concerned applesauce may upset his stomach or make him vomit. rn told pt to let her know if he vomits.

## 2015-04-17 NOTE — ED Notes (Signed)
rn went to check on pt, pt had eaten applesauce, rn asked if pt had vomited, pt said "no I'm holding it down". Pt asking for nausea meds, rn explain she had just given pt reglan for nausea.

## 2015-04-17 NOTE — ED Provider Notes (Signed)
CSN: [id number]     Arrival date & time 04/17/15  1329 History   First MD Initiated Contact with Patient 04/17/15 1542     Chief Complaint  Patient presents with  . Nausea  . Emesis     (Consider location/radiation/quality/duration/timing/severity/associated sxs/prior Treatment) HPI Comments: The patient is a 24 year old male, he has recently moved here within the last month, he states that he has a history of irritable bowel syndrome, he has had appendicitis, he has been diagnosed with peptic ulcer disease by endoscopy, he denies taking any chronic medications and states that sometimes this flares up and causes vomiting, abdominal pain, he has been having 3 days of persistent symptoms and does not seem to be improving, had lab work and a CT scan which were all fairly unremarkable except for a slight leukocytosis. He states that he is not had any relief of his symptoms and has no medication a by medicines for this. He states this is similar to prior flareups.  Patient is a 24 y.o. male presenting with vomiting. The history is provided by the patient.  Emesis   Past Medical History  Diagnosis Date  . Peptic ulcer   . Gastritis    Past Surgical History  Procedure Laterality Date  . Cholecystectomy     No family history on file. Social History  Substance Use Topics  . Smoking status: Current Every Day Smoker -- 1.00 packs/day  . Smokeless tobacco: None  . Alcohol Use: Yes     Comment: Pt states "I ain't drunk in 2 weeks.  I don't drink every day."    Review of Systems  Gastrointestinal: Positive for vomiting.  All other systems reviewed and are negative.     Allergies  Haldol; Thorazine; Toradol; Tramadol; Trazodone and nefazodone; and Penicillins  Home Medications   Prior to Admission medications   Medication Sig Start Date End Date Taking? Authorizing Provider  famotidine (PEPCID) 20 MG tablet Take 1 tablet (20 mg total) by mouth 2 (two) times daily. 04/17/15   Eber Hong, MD  pantoprazole (PROTONIX) 20 MG tablet Take 1 tablet (20 mg total) by mouth daily. 04/16/15   Geoffery Lyons, MD  promethazine (PHENERGAN) 25 MG suppository Place 1 suppository (25 mg total) rectally every 6 (six) hours as needed for nausea or vomiting. 04/17/15   Eber Hong, MD  promethazine (PHENERGAN) 25 MG tablet Take 1 tablet (25 mg total) by mouth every 6 (six) hours as needed for nausea. 04/16/15   Geoffery Lyons, MD   BP 153/94 mmHg  Pulse 75  Temp(Src) 98.8 F (37.1 C) (Oral)  Resp 17  SpO2 100% Physical Exam  Constitutional: He appears well-developed and well-nourished. He appears distressed (appears Uncomfortable and colicky).  HENT:  Head: Normocephalic and atraumatic.  Mouth/Throat: Oropharynx is clear and moist. No oropharyngeal exudate.  No blood in the mouth, moist mucous membranes  Eyes: Conjunctivae and EOM are normal. Pupils are equal, round, and reactive to light. Right eye exhibits no discharge. Left eye exhibits no discharge. No scleral icterus.  No signs of Jaundice  Neck: Normal range of motion. Neck supple. No JVD present. No thyromegaly present.  Cardiovascular: Normal rate, regular rhythm, normal heart sounds and intact distal pulses.  Exam reveals no gallop and no friction rub.   No murmur heard. Pulmonary/Chest: Effort normal and breath sounds normal. No respiratory distress. He has no wheezes. He has no rales.  Abdominal: Soft. Bowel sounds are normal. He exhibits no distension and no mass. There  is tenderness ( tenderness across the lower abdomen and left upper quadrant, minimal epigastric pain, no peritoneal signs or guarding, no CVA tenderness ).  Musculoskeletal: Normal range of motion. He exhibits no edema or tenderness.  Lymphadenopathy:    He has no cervical adenopathy.  Neurological: He is alert. Coordination normal.  Skin: Skin is warm and dry. No rash noted. No erythema.  Psychiatric: He has a normal mood and affect. His behavior is normal.   Nursing note and vitals reviewed.   ED Course  Procedures (including critical care time) Labs Review Labs Reviewed  COMPREHENSIVE METABOLIC PANEL - Abnormal; Notable for the following:    Potassium 3.3 (*)    Glucose, Bld 111 (*)    AST 51 (*)    All other components within normal limits  URINALYSIS, ROUTINE W REFLEX MICROSCOPIC (NOT AT [facility]) - Abnormal; Notable for the following:    Ketones, ur >80 (*)    All other components within normal limits  CBC WITH DIFFERENTIAL/PLATELET - Abnormal; Notable for the following:    WBC 13.1 (*)    Neutro Abs 11.8 (*)    All other components within normal limits  LIPASE, BLOOD    Imaging Review Ct Head Wo Contrast  04/16/2015   CLINICAL DATA:  Syncope with head injury on bathtub. Initial encounter.  EXAM: CT HEAD WITHOUT CONTRAST  TECHNIQUE: Contiguous axial images were obtained from the base of the skull through the vertex without intravenous contrast.  COMPARISON:  None.  FINDINGS: Skull and Sinuses:Remote appearing nasal arch fractures with depression.  Scar or laceration in the left posterior scalp near the vertex. No underlying fracture.  Adenoid hypertrophy appearance.  Orbits: No acute abnormality.  Brain: Normal. No evidence of acute infarction, hemorrhage, hydrocephalus, or mass lesion/mass effect.  IMPRESSION: 1. Normal intracranial imaging. 2. Remote appearing depressed nasal arch fractures.   Electronically Signed   By: Marnee Spring M.D.   On: 04/16/2015 00:44   US Abdomen Complete  04/17/2015   CLINICAL DATA:  Abdominal pain, nausea and vomiting for 4 days. Prior cholecystectomy.  EXAM: ULTRASOUND ABDOMEN COMPLETE  COMPARISON:  CT abdomen/pelvis from 04/16/2015.  FINDINGS: Gallbladder: Surgically absent .  Common bile duct: Diameter: 2 mm  Liver: No focal lesion identified. Within normal limits in parenchymal echogenicity.  IVC: No abnormality visualized.  Pancreas: Visualized portion unremarkable.  Spleen: Size and appearance within  normal limits.  Right Kidney: Length: 11.3 cm. Echogenicity within normal limits. No mass or hydronephrosis visualized.  Left Kidney: Length: 10.5 cm. Echogenicity within normal limits. No mass or hydronephrosis visualized.  Abdominal aorta: No aneurysm visualized.  Other findings: Patent main portal vein with appropriate flow direction.  IMPRESSION: Normal abdominal sonogram status post cholecystectomy, with no biliary ductal dilatation.   Electronically Signed   By: Delbert Phenix M.D.   On: 04/17/2015 16:45   Ct Abdomen Pelvis W Contrast  04/16/2015   CLINICAL DATA:  Acute onset of mid abdominal pain, nausea, vomiting and diarrhea. Initial encounter.  EXAM: CT ABDOMEN AND PELVIS WITH CONTRAST  TECHNIQUE: Multidetector CT imaging of the abdomen and pelvis was performed using the standard protocol following bolus administration of intravenous contrast.  CONTRAST:  OMNIPAQUE IOHEXOL 300 MG/ML  SOLN  COMPARISON:  None.  FINDINGS: The visualized lung bases are clear.  The liver and spleen are unremarkable in appearance. The patient is status post cholecystectomy, with clips noted at the gallbladder fossa. The pancreas and adrenal glands are unremarkable.  The kidneys are unremarkable  in appearance. There is no evidence of hydronephrosis. No renal or ureteral stones are seen. No perinephric stranding is appreciated.  No free fluid is identified. The small bowel is unremarkable in appearance. The stomach is within normal limits. No acute vascular abnormalities are seen.  The appendix is normal in caliber, without evidence of appendicitis. The colon is unremarkable in appearance.  The bladder is mildly distended and grossly unremarkable. Mild calcification is noted along a small urachal remnant, without a focal mass. The prostate remains normal in size. No inguinal lymphadenopathy is seen.  No acute osseous abnormalities are identified.  IMPRESSION: Unremarkable contrast-enhanced CT of the abdomen and pelvis.    Electronically Signed   By: Roanna Raider M.D.   On: 04/16/2015 00:45   I have personally reviewed and evaluated these images and lab results as part of my medical decision-making.    MDM   Final diagnoses:  Non-intractable vomiting with nausea, vomiting of unspecified type  Epigastric pain    The patient has had a voluminous amount of emesis, he has abdominal tenderness which is not surgical or pathological however would be consistent with worsening peptic ulcer disease, lab work was reviewed over the last 2 days on his back-to-back visits and there was no significant findings other than a leukocytosis. CT scan also reviewed showing no signs of acute surgical pathology. We'll be proactive and treating stomach ulcers with GI cocktail, Pepcid, Protonix, IV hydration, antibiotics, recheck labs.  The patient continues to be on the phone with either his girlfriend or his mother, they have been contentious with the nurse, stating that he needs to be admitted to the hospital. The patient tells me that he has improved, he is willing to try a GI cocktail in to be discharged home. He states that the reason he does not want to do this is because he does not have money for medications. I informed him that he would need to follow-up with a family doctor, discussed with family members who may be able to help him financially and that I would give him rectal suppositories for nausea as well as a prescription for Pepcid which would be cheaper than a proton pump inhibitor. He is agreeable to this plan. He has tolerated oral fluids and applesauce.  Meds given in ED:  Medications  gi cocktail (Maalox,Lidocaine,Donnatal) (not administered)  sodium chloride 0.9 % bolus 1,000 mL (0 mLs Intravenous Stopped 04/17/15 1754)  sodium chloride 0.9 % bolus 1,000 mL (0 mLs Intravenous Stopped 04/17/15 1754)  promethazine (PHENERGAN) injection 25 mg (25 mg Intravenous Given 04/17/15 1621)  ondansetron (ZOFRAN) injection 4 mg  (4 mg Intravenous Given 04/17/15 1610)  pantoprazole (PROTONIX) injection 40 mg (40 mg Intravenous Given 04/17/15 1610)  famotidine (PEPCID) IVPB 20 mg premix (0 mg Intravenous Stopped 04/17/15 1700)  gi cocktail (Maalox,Lidocaine,Donnatal) (30 mLs Oral Given 04/17/15 1605)  metoCLOPramide (REGLAN) injection 10 mg (10 mg Intravenous Given 04/17/15 1812)    New Prescriptions   FAMOTIDINE (PEPCID) 20 MG TABLET    Take 1 tablet (20 mg total) by mouth 2 (two) times daily.   PROMETHAZINE (PHENERGAN) 25 MG SUPPOSITORY    Place 1 suppository (25 mg total) rectally every 6 (six) hours as needed for nausea or vomiting.      Eber Hong, MD 04/17/15 [phone]

## 2015-04-17 NOTE — ED Notes (Signed)
md at bedside

## 2015-04-17 NOTE — ED Notes (Signed)
Rn and md went on [employer], both phenergan and pepcid are on the $4 list. rn explained this to pt, because pt said that prescriptions from previous ED visits he didn't get filled because he could not afford them.  rn told pt to go get his prescriptions filled and that not being able to afford meds was not an excuse because medications were on the $4 list.   Pt requesting a bus pass. Bus pass provided.

## 2015-04-17 NOTE — ED Notes (Signed)
Pt requesting to leave.

## 2015-04-18 ENCOUNTER — Encounter (HOSPITAL_COMMUNITY): Payer: Self-pay | Admitting: Emergency Medicine

## 2015-04-18 ENCOUNTER — Emergency Department (HOSPITAL_COMMUNITY)
Admission: EM | Admit: 2015-04-18 | Discharge: 2015-04-18 | Disposition: A | Discharge status: Home / Self Care | Payer: Self-pay | Attending: Emergency Medicine | Admitting: Emergency Medicine

## 2015-04-18 DIAGNOSIS — F1410 Cocaine abuse, uncomplicated: Secondary | ICD-10-CM | POA: Insufficient documentation

## 2015-04-18 DIAGNOSIS — Z720 Tobacco use: Secondary | ICD-10-CM | POA: Insufficient documentation

## 2015-04-18 DIAGNOSIS — K279 Peptic ulcer, site unspecified, unspecified as acute or chronic, without hemorrhage or perforation: Secondary | ICD-10-CM | POA: Insufficient documentation

## 2015-04-18 DIAGNOSIS — F1310 Sedative, hypnotic or anxiolytic abuse, uncomplicated: Secondary | ICD-10-CM | POA: Insufficient documentation

## 2015-04-18 DIAGNOSIS — F1210 Cannabis abuse, uncomplicated: Secondary | ICD-10-CM | POA: Insufficient documentation

## 2015-04-18 DIAGNOSIS — R1115 Cyclical vomiting syndrome unrelated to migraine: Secondary | ICD-10-CM

## 2015-04-18 LAB — CBC WITH DIFFERENTIAL/PLATELET
BASOS ABS: 0 10*3/uL (ref 0.0–0.1)
BASOS PCT: 0 %
Eosinophils Absolute: 0 10*3/uL (ref 0.0–0.7)
Eosinophils Relative: 0 %
HEMATOCRIT: 40.1 % (ref 39.0–52.0)
HEMOGLOBIN: 14 g/dL (ref 13.0–17.0)
Lymphocytes Relative: 17 %
Lymphs Abs: 1.8 10*3/uL (ref 0.7–4.0)
MCH: 29.1 pg (ref 26.0–34.0)
MCHC: 34.9 g/dL (ref 30.0–36.0)
MCV: 83.4 fL (ref 78.0–100.0)
Monocytes Absolute: 0.9 10*3/uL (ref 0.1–1.0)
Monocytes Relative: 9 %
NEUTROS ABS: 8 10*3/uL — AB (ref 1.7–7.7)
NEUTROS PCT: 74 %
Platelets: 291 10*3/uL (ref 150–400)
RBC: 4.81 MIL/uL (ref 4.22–5.81)
RDW: 12.6 % (ref 11.5–15.5)
WBC: 10.7 10*3/uL — AB (ref 4.0–10.5)

## 2015-04-18 LAB — BASIC METABOLIC PANEL
ANION GAP: 11 (ref 5–15)
BUN: 15 mg/dL (ref 6–20)
CALCIUM: 8.8 mg/dL — AB (ref 8.9–10.3)
CO2: 23 mmol/L (ref 22–32)
Chloride: 105 mmol/L (ref 101–111)
Creatinine, Ser: 1.05 mg/dL (ref 0.61–1.24)
GFR calc non Af Amer: >60 mL/min (ref >60)
Glucose, Bld: 83 mg/dL (ref 65–99)
Potassium: 3.1 mmol/L — ABNORMAL LOW (ref 3.5–5.1)
Sodium: 139 mmol/L (ref 135–145)

## 2015-04-18 LAB — URINALYSIS, ROUTINE W REFLEX MICROSCOPIC
BILIRUBIN URINE: NEGATIVE
Glucose, UA: NEGATIVE mg/dL
HGB URINE DIPSTICK: NEGATIVE
Leukocytes, UA: NEGATIVE
Nitrite: NEGATIVE
PROTEIN: NEGATIVE mg/dL
Specific Gravity, Urine: 1.021 (ref 1.005–1.030)
UROBILINOGEN UA: 0.2 mg/dL (ref 0.0–1.0)
pH: 7 (ref 5.0–8.0)

## 2015-04-18 LAB — RAPID URINE DRUG SCREEN, HOSP PERFORMED
AMPHETAMINES: NOT DETECTED
Barbiturates: POSITIVE — AB
Benzodiazepines: NOT DETECTED
Cocaine: POSITIVE — AB
OPIATES: NOT DETECTED
Tetrahydrocannabinol: POSITIVE — AB

## 2015-04-18 LAB — LIPASE, BLOOD: Lipase: 20 U/L — ABNORMAL LOW (ref 22–51)

## 2015-04-18 MED ORDER — HYDROMORPHONE HCL 1 MG/ML IJ SOLN
1.0000 mg | Freq: Once | INTRAMUSCULAR | Status: AC
  Administered 2015-04-18: 1 mg via INTRAVENOUS
  Filled 2015-04-18: qty 1

## 2015-04-18 MED ORDER — ONDANSETRON HCL 4 MG/2ML IJ SOLN
4.0000 mg | Freq: Once | INTRAMUSCULAR | Status: AC
  Administered 2015-04-18: 4 mg via INTRAVENOUS
  Filled 2015-04-18: qty 2

## 2015-04-18 MED ORDER — PANTOPRAZOLE SODIUM 40 MG IV SOLR
40.0000 mg | Freq: Once | INTRAVENOUS | Status: AC
  Administered 2015-04-18: 40 mg via INTRAVENOUS
  Filled 2015-04-18: qty 40

## 2015-04-18 MED ORDER — PROMETHAZINE HCL 25 MG/ML IJ SOLN
25.0000 mg | Freq: Once | INTRAMUSCULAR | Status: AC
  Administered 2015-04-18: 25 mg via INTRAVENOUS
  Filled 2015-04-18: qty 1

## 2015-04-18 MED ORDER — SODIUM CHLORIDE 0.9 % IV BOLUS (SEPSIS)
1000.0000 mL | Freq: Once | INTRAVENOUS | Status: AC
  Administered 2015-04-18: 1000 mL via INTRAVENOUS

## 2015-04-18 NOTE — ED Notes (Signed)
Pt states that he feels more nauseous when he drinks water, but feels better when he stops.

## 2015-04-18 NOTE — ED Notes (Signed)
Pt. Given water for PO challenge. 

## 2015-04-18 NOTE — ED Notes (Signed)
Pt given water for a fluid challenge. Pt states that he feels better, only slightly nauseous.

## 2015-04-18 NOTE — ED Notes (Signed)
Pt given urinal and instructed to provide urine sample; pt verbalized understanding 

## 2015-04-18 NOTE — ED Notes (Signed)
Patient is resting comfortably. 

## 2015-04-18 NOTE — ED Notes (Signed)
Medical records authorization forms sent/received.

## 2015-04-18 NOTE — ED Notes (Signed)
Pt. reports persistent nausea and vomitting with generalized abdominal pain for several days seen at [facility] ER this evening blood tests and abdominal ultrasound done / received antiemetics with no relief. Pt. given Zofran by EMS prior to arrival .

## 2015-04-18 NOTE — Discharge Instructions (Signed)
It was our pleasure to provide your ER care today - we hope that you feel better.  Continue your acid blocker therapy.    Avoid any marijuana use as that can cause a recurrent vomiting syndrome.   Follow up with primary care doctor in coming week.  You were given sedating medication in the ER - no driving for the next 4 hours.  Return to ER if worse, new symptoms, fevers, persistent vomiting, other concern.     Cyclic Vomiting Syndrome Cyclic vomiting syndrome is a benign condition in which patients experience bouts or cycles of severe nausea and vomiting that last for hours or even days. The bouts of nausea and vomiting alternate with longer periods of no symptoms and generally good health. Cyclic vomiting syndrome occurs mostly in children, but can affect adults. CAUSES  [facility] has no known cause. Each episode is typically similar to the previous ones. The episodes tend to:   Start at about the same time of day.  Last the same length of time.  Present the same symptoms at the same level of intensity. Cyclic vomiting syndrome can begin at any age in children and adults. Cyclic vomiting syndrome usually starts between the ages of 3 and 7 years. In adults, episodes tend to occur less often than they do in children, but they last longer. Furthermore, the events or situations that trigger episodes in adults cannot always be pinpointed as easily as they can in children. There are 4 phases of cyclic vomiting syndrome:  Prodrome. The prodrome phase signals that an episode of nausea and vomiting is about to begin. This phase can last from just a few minutes to several hours. This phase is often marked by belly (abdominal) pain. Sometimes taking medicine early in the prodrome phase can stop an episode in progress. However, sometimes there is no warning. A person may simply wake up in the middle of the night or early morning and begin vomiting.  Episode. The episode phase consists of:  Severe  vomiting.  Nausea.  Gagging (retching).  Recovery. The recovery phase begins when the nausea and vomiting stop. Healthy color, appetite, and energy return.  Symptom-free interval. The symptom-free interval phase is the period between episodes when no symptoms are present. TRIGGERS Episodes can be triggered by an infection or event. Examples of triggers include:  Infections.  Colds, allergies, sinus problems, and the flu.  Eating certain foods such as chocolate or cheese.  Foods with monosodium glutamate (MSG) or preservatives.  Fast foods.  Pre-packaged foods.  Foods with low nutritional value (junk foods).  Overeating.  Eating just before going to bed.  Hot weather.  Dehydration.  Not enough sleep or poor sleep quality.  Physical exhaustion.  Menstruation.  Motion sickness.  Emotional stress (school or home difficulties).  Excitement or stress. SYMPTOMS  The main symptoms of cyclic vomiting syndrome are:  Severe vomiting.  Nausea.  Gagging (retching). Episodes usually begin at night or the first thing in the morning. Episodes may include vomiting or retching up to 5 or 6 times an hour during the worst of the episode. Episodes usually last anywhere from 1 to 4 days. Episodes can last for up to 10 days. Other symptoms include:  Paleness.  Exhaustion.  Listlessness.  Abdominal pain.  Loose stools or diarrhea. Sometimes the nausea and vomiting are so severe that a person appears to be almost unconscious. Sensitivity to light, headache, fever, dizziness, may also accompany an episode. In addition, the vomiting may cause drooling  and excessive thirst. Drinking water usually leads to more vomiting, though the water can dilute the acid in the vomit, making the episode a little less painful. Continuous vomiting can lead to dehydration, which means that the body has lost excessive water and salts. DIAGNOSIS  Cyclic vomiting syndrome is hard to diagnose because  there are no clear tests to identify it. A caregiver must diagnose cyclic vomiting syndrome by looking at symptoms and medical history. A caregiver must exclude more common diseases or disorders that can also cause nausea and vomiting. Also, diagnosis takes time because caregivers need to identify a pattern or cycle to the vomiting. TREATMENT  Cyclic vomiting syndrome cannot be cured. Treatment varies, but people with cyclic vomiting syndrome should get plenty of rest and sleep and take medications that prevent, stop, or lessen the vomiting episodes and other symptoms. People whose episodes are frequent and long-lasting may be treated during the symptom-free intervals in an effort to prevent or ease future episodes. The symptom-free phase is a good time to eliminate anything known to trigger an episode. For example, if episodes are brought on by stress or excitement, this period is the time to find ways to reduce stress and stay calm. If sinus problems or allergies cause episodes, those conditions should be treated. The triggers listed above should be avoided or prevented. Because of the similarities between migraine and cyclic vomiting syndrome, caregivers treat some people with severe cyclic vomiting syndrome with drugs that are also used for migraine headaches. The drugs are designed to:  Prevent episodes.  Reduce their frequency.  Lessen their severity. HOME CARE INSTRUCTIONS Once a vomiting episode begins, treatment is supportive. It helps to stay in bed and sleep in a dark, quiet room. Severe nausea and vomiting may require hospitalization and intravenous (IV) fluids to prevent dehydration. Relaxing medications (sedatives) may help if the nausea continues. Sometimes, during the prodrome phase, it is possible to stop an episode from happening altogether. Only take over-the-counter or prescription medicines for pain, discomfort or fever as directed by your caregiver. Do not give aspirin to  children. During the recovery phase, drinking water and replacing lost electrolytes (salts in the blood) are very important. Electrolytes are salts that the body needs to function well and stay healthy. Symptoms during the recovery phase can vary. Some people find that their appetites return to normal immediately, while others need to begin by drinking clear liquids and then move slowly to solid food. RELATED COMPLICATIONS The severe vomiting that defines cyclic vomiting syndrome is a risk factor for several complications:  Dehydration--Vomiting causes the body to lose water quickly.  Electrolyte imbalance--Vomiting also causes the body to lose the important salts it needs to keep working properly.  Peptic esophagitis--The tube that connects the mouth to the stomach (esophagus) becomes injured from the stomach acid that comes up with the vomit.  Hematemesis--The esophagus becomes irritated and bleeds, so blood mixes with the vomit.  Mallory-Weiss tear--The lower end of the esophagus may tear open or the stomach may bruise from vomiting or retching.  Tooth decay--The acid in the vomit can hurt the teeth by corroding the tooth enamel. SEEK MEDICAL CARE IF: You have questions or problems. Document Released: 09/09/2001 Document Revised: 09/23/2011 Document Reviewed: 10/08/2010 [facility] Patient Information 2015 [place], [place]. This information is not intended to replace advice given to you by your health care provider. Make sure you discuss any questions you have with your health care provider.    Gastritis, Adult Gastritis is soreness  and swelling (inflammation) of the lining of the stomach. Gastritis can develop as a sudden onset (acute) or long-term (chronic) condition. If gastritis is not treated, it can lead to stomach bleeding and ulcers. CAUSES  Gastritis occurs when the stomach lining is weak or damaged. Digestive juices from the stomach then inflame the weakened stomach lining. The  stomach lining may be weak or damaged due to viral or bacterial infections. One common bacterial infection is the Helicobacter pylori infection. Gastritis can also result from excessive alcohol consumption, taking certain medicines, or having too much acid in the stomach.  SYMPTOMS  In some cases, there are no symptoms. When symptoms are present, they may include:  Pain or a burning sensation in the upper abdomen.  Nausea.  Vomiting.  An uncomfortable feeling of fullness after eating. DIAGNOSIS  Your caregiver may suspect you have gastritis based on your symptoms and a physical exam. To determine the cause of your gastritis, your caregiver may perform the following:  Blood or stool tests to check for the H pylori bacterium.  Gastroscopy. A thin, flexible tube (endoscope) is passed down the esophagus and into the stomach. The endoscope has a light and camera on the end. Your caregiver uses the endoscope to view the inside of the stomach.  Taking a tissue sample (biopsy) from the stomach to examine under a microscope. TREATMENT  Depending on the cause of your gastritis, medicines may be prescribed. If you have a bacterial infection, such as an H pylori infection, antibiotics may be given. If your gastritis is caused by too much acid in the stomach, H2 blockers or antacids may be given. Your caregiver may recommend that you stop taking aspirin, ibuprofen, or other nonsteroidal anti-inflammatory drugs (NSAIDs). HOME CARE INSTRUCTIONS  Only take over-the-counter or prescription medicines as directed by your caregiver.  If you were given antibiotic medicines, take them as directed. Finish them even if you start to feel better.  Drink enough fluids to keep your urine clear or pale yellow.  Avoid foods and drinks that make your symptoms worse, such as:  Caffeine or alcoholic drinks.  Chocolate.  Peppermint or mint flavorings.  Garlic and onions.  Spicy foods.  Citrus fruits, such as  oranges, lemons, or limes.  Tomato-based foods such as sauce, chili, salsa, and pizza.  Fried and fatty foods.  Eat small, frequent meals instead of large meals. SEEK IMMEDIATE MEDICAL CARE IF:   You have black or dark red stools.  You vomit blood or material that looks like coffee grounds.  You are unable to keep fluids down.  Your abdominal pain gets worse.  You have a fever.  You do not feel better after 1 week.  You have any other questions or concerns. MAKE SURE YOU:  Understand these instructions.  Will watch your condition.  Will get help right away if you are not doing well or get worse. Document Released: 06/25/2001 Document Revised: 12/31/2011 Document Reviewed: 08/14/2011 [facility] Patient Information 2015 [place], [place]. This information is not intended to replace advice given to you by your health care provider. Make sure you discuss any questions you have with your health care provider.

## 2015-04-18 NOTE — ED Notes (Signed)
Nanavati at bedside.  

## 2015-04-18 NOTE — ED Notes (Signed)
Request copy of colonoscopy results from [facility] @ [phone].

## 2015-04-18 NOTE — ED Provider Notes (Signed)
CSN: [id number]   Arrival date & time 04/18/15 0113  History  By signing my name below, I, Bethel Born, attest that this documentation has been prepared under the direction and in the presence of Derwood Kaplan, MD. Electronically Signed: Bethel Born, ED Scribe. 04/18/2015. 3:52 AM.  Chief Complaint  Patient presents with  . Emesis    HPI The history is provided by the patient. No language interpreter was used.   Jaylenn Altier is a 24 y.o. male with PMHx of gastritis and peptic ulcer who presents to the Emergency Department complaining of constant and diffuse abdominal pain with onset 4 days ago. The pain is described as aching and rated 8/10 in severity. Associated symptoms include nausea and vomiting. Pt notes being unable to tolerate food or water. He states that he has had similar symptoms frequently in the past but left the medication that he was prescribed at a house that he vacated.  He is from [place] and usually has care there. He was last scoped 4-5 months ago.   Pt was seen in the ED less than 24 hours ago for the same symptoms.   Past Medical History  Diagnosis Date  . Peptic ulcer   . Gastritis     Past Surgical History  Procedure Laterality Date  . Cholecystectomy      No family history on file.  Social History  Substance Use Topics  . Smoking status: Current Every Day Smoker -- 1.00 packs/day  . Smokeless tobacco: None  . Alcohol Use: Yes     Review of Systems  Constitutional: Negative for fever and chills.  Cardiovascular: Negative for chest pain.  Gastrointestinal: Positive for nausea, vomiting and abdominal pain.  Neurological: Negative for weakness.  All other systems reviewed and are negative.  Home Medications   Prior to Admission medications   Medication Sig Start Date End Date Taking? Authorizing Provider  famotidine (PEPCID) 20 MG tablet Take 1 tablet (20 mg total) by mouth 2 (two) times daily. Patient not taking: Reported on  04/18/2015 04/17/15   Eber Hong, MD  pantoprazole (PROTONIX) 20 MG tablet Take 1 tablet (20 mg total) by mouth daily. Patient not taking: Reported on 04/18/2015 04/16/15   Geoffery Lyons, MD  promethazine (PHENERGAN) 25 MG suppository Place 1 suppository (25 mg total) rectally every 6 (six) hours as needed for nausea or vomiting. Patient not taking: Reported on 04/18/2015 04/17/15   Eber Hong, MD  promethazine (PHENERGAN) 25 MG tablet Take 1 tablet (25 mg total) by mouth every 6 (six) hours as needed for nausea. Patient not taking: Reported on 04/18/2015 04/16/15   Geoffery Lyons, MD  promethazine (PHENERGAN) 25 MG tablet Take 1 tablet (25 mg total) by mouth every 6 (six) hours as needed for nausea or vomiting. Patient not taking: Reported on 04/18/2015 04/17/15   Eber Hong, MD    Allergies  Haldol; Thorazine; Toradol; Tramadol; Trazodone and nefazodone; and Penicillins  Triage Vitals: BP 140/65 mmHg  Pulse 76  Temp(Src) 98.5 F (36.9 C) (Oral)  Resp 16  SpO2 100%  Physical Exam  Constitutional: He is oriented to person, place, and time. He appears well-developed and well-nourished. No distress.  HENT:  Head: Normocephalic and atraumatic.  Eyes: Conjunctivae and EOM are normal.  Neck: Neck supple. No tracheal deviation present.  Cardiovascular: Normal rate.   Pulmonary/Chest: Effort normal. No respiratory distress.  Abdominal: There is tenderness. There is guarding.  Musculoskeletal: Normal range of motion.  Neurological: He is alert and oriented to  person, place, and time.  Skin: Skin is warm and dry.  Psychiatric: He has a normal mood and affect. His behavior is normal.  Nursing note and vitals reviewed.   ED Course  Procedures   DIAGNOSTIC STUDIES: Oxygen Saturation is 100% on RA, normal by my interpretation.    COORDINATION OF CARE: 3:47 AM Discussed treatment plan which includes lab work, Protonix, Dilaudid, Zofran, and IVF with pt at bedside and pt agreed to  plan.  Labs Reviewed  CBC WITH DIFFERENTIAL/PLATELET - Abnormal; Notable for the following:    WBC 10.7 (*)    Neutro Abs 8.0 (*)    All other components within normal limits  BASIC METABOLIC PANEL - Abnormal; Notable for the following:    Potassium 3.1 (*)    Calcium 8.8 (*)    All other components within normal limits  LIPASE, BLOOD - Abnormal; Notable for the following:    Lipase 20 (*)    All other components within normal limits  URINALYSIS, ROUTINE W REFLEX MICROSCOPIC (NOT AT [facility])    Imaging Review US Abdomen Complete  04/17/2015   CLINICAL DATA:  Abdominal pain, nausea and vomiting for 4 days. Prior cholecystectomy.  EXAM: ULTRASOUND ABDOMEN COMPLETE  COMPARISON:  CT abdomen/pelvis from 04/16/2015.  FINDINGS: Gallbladder: Surgically absent .  Common bile duct: Diameter: 2 mm  Liver: No focal lesion identified. Within normal limits in parenchymal echogenicity.  IVC: No abnormality visualized.  Pancreas: Visualized portion unremarkable.  Spleen: Size and appearance within normal limits.  Right Kidney: Length: 11.3 cm. Echogenicity within normal limits. No mass or hydronephrosis visualized.  Left Kidney: Length: 10.5 cm. Echogenicity within normal limits. No mass or hydronephrosis visualized.  Abdominal aorta: No aneurysm visualized.  Other findings: Patent main portal vein with appropriate flow direction.  IMPRESSION: Normal abdominal sonogram status post cholecystectomy, with no biliary ductal dilatation.   Electronically Signed   By: Delbert Phenix M.D.   On: 04/17/2015 16:45    :45: Pt's pain has resolved. Records from [facility] med center confirms that pt had an EHD with diffuse gastritis, some hemorrhagic. If she passed po challenge, we will d.c  MDM   Final diagnoses:  PUD (peptic ulcer disease)  Intractable cyclical vomiting with nausea     I personally performed the services described in this documentation, which was scribed in my presence. The recorded  information has been reviewed and is accurate.   Pt with PUD comes in with cc of nausea, emesis and abd pain. He has upper quad abd tenderness and neg cT and [place] in the recent past. 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 He did have EGD in July which shows multiple stomach ulcers.  Will get basic labs. Will try to get symptoms in control. ER bvisit lab results and Ct reviewed.  Derwood Kaplan, MD 04/18/15 [id number]

## 2015-04-18 NOTE — ED Notes (Signed)
MD at bedside. 

## 2015-11-23 ENCOUNTER — Inpatient Hospital Stay (HOSPITAL_COMMUNITY)
Admission: EM | Admit: 2015-11-23 | Discharge: 2015-11-26 | DRG: 894 | Discharge status: Against Medical Advice | Payer: Self-pay | Attending: Internal Medicine | Admitting: Internal Medicine

## 2015-11-23 ENCOUNTER — Encounter (HOSPITAL_COMMUNITY): Payer: Self-pay | Admitting: Nurse Practitioner

## 2015-11-23 DIAGNOSIS — F1910 Other psychoactive substance abuse, uncomplicated: Secondary | ICD-10-CM | POA: Diagnosis present

## 2015-11-23 DIAGNOSIS — D72829 Elevated white blood cell count, unspecified: Secondary | ICD-10-CM | POA: Diagnosis present

## 2015-11-23 DIAGNOSIS — K219 Gastro-esophageal reflux disease without esophagitis: Secondary | ICD-10-CM | POA: Diagnosis present

## 2015-11-23 DIAGNOSIS — R0602 Shortness of breath: Secondary | ICD-10-CM | POA: Diagnosis present

## 2015-11-23 DIAGNOSIS — F1290 Cannabis use, unspecified, uncomplicated: Secondary | ICD-10-CM | POA: Diagnosis present

## 2015-11-23 DIAGNOSIS — F419 Anxiety disorder, unspecified: Secondary | ICD-10-CM | POA: Diagnosis present

## 2015-11-23 DIAGNOSIS — R748 Abnormal levels of other serum enzymes: Secondary | ICD-10-CM | POA: Diagnosis present

## 2015-11-23 DIAGNOSIS — F1410 Cocaine abuse, uncomplicated: Principal | ICD-10-CM | POA: Diagnosis present

## 2015-11-23 DIAGNOSIS — R112 Nausea with vomiting, unspecified: Secondary | ICD-10-CM | POA: Diagnosis present

## 2015-11-23 DIAGNOSIS — N179 Acute kidney failure, unspecified: Secondary | ICD-10-CM | POA: Diagnosis present

## 2015-11-23 DIAGNOSIS — Z885 Allergy status to narcotic agent status: Secondary | ICD-10-CM

## 2015-11-23 DIAGNOSIS — R1013 Epigastric pain: Secondary | ICD-10-CM | POA: Diagnosis present

## 2015-11-23 DIAGNOSIS — Z8711 Personal history of peptic ulcer disease: Secondary | ICD-10-CM

## 2015-11-23 DIAGNOSIS — Z888 Allergy status to other drugs, medicaments and biological substances status: Secondary | ICD-10-CM

## 2015-11-23 DIAGNOSIS — Z880 Allergy status to penicillin: Secondary | ICD-10-CM

## 2015-11-23 DIAGNOSIS — R011 Cardiac murmur, unspecified: Secondary | ICD-10-CM | POA: Diagnosis present

## 2015-11-23 LAB — CBC
HEMATOCRIT: 48.6 % (ref 39.0–52.0)
HEMOGLOBIN: 17.7 g/dL — AB (ref 13.0–17.0)
MCH: 30.2 pg (ref 26.0–34.0)
MCHC: 36.4 g/dL — ABNORMAL HIGH (ref 30.0–36.0)
MCV: 82.8 fL (ref 78.0–100.0)
Platelets: 242 10*3/uL (ref 150–400)
RBC: 5.87 MIL/uL — AB (ref 4.22–5.81)
RDW: 12.7 % (ref 11.5–15.5)
WBC: 25 10*3/uL — AB (ref 4.0–10.5)

## 2015-11-23 LAB — COMPREHENSIVE METABOLIC PANEL
ALT: 16 U/L — AB (ref 17–63)
AST: 26 U/L (ref 15–41)
Albumin: 4.6 g/dL (ref 3.5–5.0)
Alkaline Phosphatase: 52 U/L (ref 38–126)
Anion gap: 15 (ref 5–15)
BUN: 16 mg/dL (ref 6–20)
CHLORIDE: 109 mmol/L (ref 101–111)
CO2: 20 mmol/L — AB (ref 22–32)
Calcium: 9.5 mg/dL (ref 8.9–10.3)
Creatinine, Ser: 1.35 mg/dL — ABNORMAL HIGH (ref 0.61–1.24)
Glucose, Bld: 181 mg/dL — ABNORMAL HIGH (ref 65–99)
POTASSIUM: 3.6 mmol/L (ref 3.5–5.1)
SODIUM: 144 mmol/L (ref 135–145)
Total Bilirubin: 0.9 mg/dL (ref 0.3–1.2)
Total Protein: 6.9 g/dL (ref 6.5–8.1)

## 2015-11-23 LAB — LIPASE, BLOOD: LIPASE: 91 U/L — AB (ref 11–51)

## 2015-11-23 MED ORDER — FENTANYL CITRATE (PF) 100 MCG/2ML IJ SOLN
50.0000 ug | INTRAMUSCULAR | Status: AC | PRN
  Administered 2015-11-23 – 2015-11-24 (×2): 50 ug via INTRAVENOUS
  Filled 2015-11-23 (×2): qty 2

## 2015-11-23 MED ORDER — SODIUM CHLORIDE 0.9 % IV BOLUS (SEPSIS)
1000.0000 mL | Freq: Once | INTRAVENOUS | Status: AC
  Administered 2015-11-24: 1000 mL via INTRAVENOUS

## 2015-11-23 MED ORDER — ONDANSETRON HCL 4 MG/2ML IJ SOLN
4.0000 mg | Freq: Once | INTRAMUSCULAR | Status: AC | PRN
  Administered 2015-11-23: 4 mg via INTRAVENOUS
  Filled 2015-11-23: qty 2

## 2015-11-23 MED ORDER — PANTOPRAZOLE SODIUM 40 MG IV SOLR
40.0000 mg | Freq: Once | INTRAVENOUS | Status: AC
  Administered 2015-11-24: 40 mg via INTRAVENOUS
  Filled 2015-11-23: qty 40

## 2015-11-23 MED ORDER — METOCLOPRAMIDE HCL 5 MG/ML IJ SOLN
10.0000 mg | Freq: Once | INTRAMUSCULAR | Status: AC
  Administered 2015-11-24: 10 mg via INTRAVENOUS
  Filled 2015-11-23: qty 2

## 2015-11-23 NOTE — ED Notes (Signed)
Bed: WA09 Expected date:  Expected time:  Means of arrival:  Comments: EMS-vomiting 

## 2015-11-23 NOTE — ED Notes (Signed)
Pt c/o nausea and vomiting, remarks hx gastritis has been out of medications. Dry heaving on arrival.

## 2015-11-24 ENCOUNTER — Encounter (HOSPITAL_COMMUNITY): Payer: Self-pay | Admitting: Radiology

## 2015-11-24 ENCOUNTER — Other Ambulatory Visit (HOSPITAL_COMMUNITY): Payer: Self-pay

## 2015-11-24 ENCOUNTER — Emergency Department (HOSPITAL_COMMUNITY): Payer: Self-pay

## 2015-11-24 ENCOUNTER — Observation Stay (HOSPITAL_COMMUNITY): Payer: Self-pay

## 2015-11-24 DIAGNOSIS — D72829 Elevated white blood cell count, unspecified: Secondary | ICD-10-CM

## 2015-11-24 DIAGNOSIS — R1110 Vomiting, unspecified: Secondary | ICD-10-CM

## 2015-11-24 DIAGNOSIS — N179 Acute kidney failure, unspecified: Secondary | ICD-10-CM | POA: Diagnosis present

## 2015-11-24 DIAGNOSIS — R112 Nausea with vomiting, unspecified: Secondary | ICD-10-CM | POA: Diagnosis present

## 2015-11-24 DIAGNOSIS — R1013 Epigastric pain: Secondary | ICD-10-CM | POA: Diagnosis present

## 2015-11-24 DIAGNOSIS — R748 Abnormal levels of other serum enzymes: Secondary | ICD-10-CM | POA: Diagnosis present

## 2015-11-24 DIAGNOSIS — R011 Cardiac murmur, unspecified: Secondary | ICD-10-CM

## 2015-11-24 DIAGNOSIS — F1910 Other psychoactive substance abuse, uncomplicated: Secondary | ICD-10-CM | POA: Diagnosis present

## 2015-11-24 DIAGNOSIS — R0602 Shortness of breath: Secondary | ICD-10-CM

## 2015-11-24 LAB — BASIC METABOLIC PANEL
Anion gap: 10 (ref 5–15)
BUN: 15 mg/dL (ref 6–20)
CO2: 23 mmol/L (ref 22–32)
CREATININE: 1.18 mg/dL (ref 0.61–1.24)
Calcium: 8.6 mg/dL — ABNORMAL LOW (ref 8.9–10.3)
Chloride: 111 mmol/L (ref 101–111)
GFR calc Af Amer: >60 mL/min (ref >60)
GLUCOSE: 156 mg/dL — AB (ref 65–99)
Potassium: 3.9 mmol/L (ref 3.5–5.1)
Sodium: 144 mmol/L (ref 135–145)

## 2015-11-24 LAB — URINALYSIS, ROUTINE W REFLEX MICROSCOPIC
BILIRUBIN URINE: NEGATIVE
Glucose, UA: NEGATIVE mg/dL
Hgb urine dipstick: NEGATIVE
LEUKOCYTES UA: NEGATIVE
NITRITE: NEGATIVE
Protein, ur: NEGATIVE mg/dL
pH: 7 (ref 5.0–8.0)

## 2015-11-24 LAB — LACTIC ACID, PLASMA: LACTIC ACID, VENOUS: 2 mmol/L (ref 0.5–2.0)

## 2015-11-24 LAB — RAPID URINE DRUG SCREEN, HOSP PERFORMED
Amphetamines: NOT DETECTED
BARBITURATES: NOT DETECTED
BENZODIAZEPINES: POSITIVE — AB
COCAINE: POSITIVE — AB
Opiates: NOT DETECTED
Tetrahydrocannabinol: POSITIVE — AB

## 2015-11-24 LAB — CBC
HCT: 44.6 % (ref 39.0–52.0)
HEMOGLOBIN: 15.5 g/dL (ref 13.0–17.0)
MCH: 29.4 pg (ref 26.0–34.0)
MCHC: 34.8 g/dL (ref 30.0–36.0)
MCV: 84.6 fL (ref 78.0–100.0)
PLATELETS: 221 10*3/uL (ref 150–400)
RBC: 5.27 MIL/uL (ref 4.22–5.81)
RDW: 12.9 % (ref 11.5–15.5)
WBC: 19.3 10*3/uL — AB (ref 4.0–10.5)

## 2015-11-24 LAB — CK: Total CK: 150 U/L (ref 49–397)

## 2015-11-24 LAB — ETHANOL

## 2015-11-24 MED ORDER — ACETAMINOPHEN 325 MG PO TABS: 325.0000 mg | ORAL_TABLET | Freq: Four times a day (QID) | ORAL | Status: DC | PRN

## 2015-11-24 MED ORDER — ENOXAPARIN SODIUM 40 MG/0.4ML ~~LOC~~ SOLN
40.0000 mg | SUBCUTANEOUS | Status: DC
  Administered 2015-11-24 – 2015-11-26 (×3): 40 mg via SUBCUTANEOUS
  Filled 2015-11-24 (×3): qty 0.4

## 2015-11-24 MED ORDER — LORAZEPAM 2 MG/ML IJ SOLN
0.5000 mg | INTRAMUSCULAR | Status: DC | PRN
  Administered 2015-11-24 – 2015-11-26 (×8): 1 mg via INTRAVENOUS
  Filled 2015-11-24 (×8): qty 1

## 2015-11-24 MED ORDER — ONDANSETRON HCL 4 MG/2ML IJ SOLN
4.0000 mg | Freq: Four times a day (QID) | INTRAMUSCULAR | Status: DC | PRN
  Administered 2015-11-24 – 2015-11-25 (×6): 4 mg via INTRAVENOUS
  Filled 2015-11-24 (×7): qty 2

## 2015-11-24 MED ORDER — SODIUM CHLORIDE 0.9 % IV SOLN
INTRAVENOUS | Status: DC
  Administered 2015-11-24 – 2015-11-26 (×3): via INTRAVENOUS

## 2015-11-24 MED ORDER — ONDANSETRON HCL 4 MG PO TABS: 4.0000 mg | ORAL_TABLET | Freq: Four times a day (QID) | ORAL | Status: DC | PRN

## 2015-11-24 MED ORDER — IOPAMIDOL (ISOVUE-300) INJECTION 61%
100.0000 mL | Freq: Once | INTRAVENOUS | Status: AC | PRN
  Administered 2015-11-24: 100 mL via INTRAVENOUS

## 2015-11-24 MED ORDER — PANTOPRAZOLE SODIUM 40 MG IV SOLR
40.0000 mg | Freq: Two times a day (BID) | INTRAVENOUS | Status: DC
  Administered 2015-11-24 – 2015-11-26 (×5): 40 mg via INTRAVENOUS
  Filled 2015-11-24 (×5): qty 40

## 2015-11-24 MED ORDER — GI COCKTAIL ~~LOC~~
30.0000 mL | Freq: Three times a day (TID) | ORAL | Status: DC | PRN
  Administered 2015-11-24 – 2015-11-25 (×3): 30 mL via ORAL
  Filled 2015-11-24 (×3): qty 30

## 2015-11-24 MED ORDER — ALBUTEROL SULFATE (2.5 MG/3ML) 0.083% IN NEBU: 2.5000 mg | INHALATION_SOLUTION | RESPIRATORY_TRACT | Status: DC | PRN

## 2015-11-24 MED ORDER — METOCLOPRAMIDE HCL 5 MG/ML IJ SOLN
10.0000 mg | Freq: Once | INTRAMUSCULAR | Status: AC
  Administered 2015-11-24: 10 mg via INTRAVENOUS
  Filled 2015-11-24: qty 2

## 2015-11-24 MED ORDER — LORAZEPAM 2 MG/ML IJ SOLN
2.0000 mg | Freq: Once | INTRAMUSCULAR | Status: AC
  Administered 2015-11-24: 2 mg via INTRAVENOUS
  Filled 2015-11-24: qty 1

## 2015-11-24 MED ORDER — PROCHLORPERAZINE EDISYLATE 5 MG/ML IJ SOLN
10.0000 mg | INTRAMUSCULAR | Status: DC | PRN
  Administered 2015-11-24 – 2015-11-26 (×7): 10 mg via INTRAVENOUS
  Filled 2015-11-24 (×8): qty 2

## 2015-11-24 MED ORDER — SUCRALFATE 1 GM/10ML PO SUSP
1.0000 g | Freq: Three times a day (TID) | ORAL | Status: DC
  Administered 2015-11-24 – 2015-11-26 (×7): 1 g via ORAL
  Filled 2015-11-24 (×7): qty 10

## 2015-11-24 MED ORDER — CETYLPYRIDINIUM CHLORIDE 0.05 % MT LIQD
7.0000 mL | Freq: Two times a day (BID) | OROMUCOSAL | Status: DC
  Administered 2015-11-24 – 2015-11-26 (×3): 7 mL via OROMUCOSAL

## 2015-11-24 NOTE — H&P (Addendum)
History and Physical    Miguel Roman ZOX:[id number] DOB: June 15, 1991 DOA: 11/23/2015  Referring MD/NP/PA: Althea Grimmer, PA  PCP: No primary care provider on file.  Patient coming from: home  Chief Complaint: Nausea and vomiting  HPI: Miguel Roman is a 25 y.o. male with medical history significant of gastritis, PUD, and polysubstance abuse (including cocaine, marijuana, barbiturates, tobacco, alcohol); who presents with complaints of nausea and vomiting since yesterday. Patient notes that vomitus is nonbloody, but looks like bile. He reports constant nausea and vomiting unable to keep any food or liquids down. Associated symptoms include sharp epigastric pains after retching, some mild shortness of breath, and diarrhea with each vomiting episode. Patient denies trying anything to relieve symptoms. Patient admits to using ibuprofen 1-2 times per week to treat intermittent headaches. When asked directly patient admits to snorting cocaine within the last few days and smoking marijuana approximately 2 weeks ago. He denies IV drug abuse and states that he donates plasma as a cause for track marks. Denies having any chest pain, rash suicidal ideations, fevers, bleeding in stool or urine. Patient notes having similar symptoms intermittently over the last 2 years. He denies any recent alcohol use.  ED Course: Upon admission patient was seen to have relatively normal vital signs. Blood work revealed elevated WBC of 25, hemoglobin 17.7, CO2 20, creatinine of 1.35, BUN of 16, and glucose of 181. Urinalysis showed no acute signs of infection,  positive for ketones, and elevated specific gravity. UDS pending. Previously seen to be positive for barbiturates, cocaine, and marijuana back in 04/2015.  Review of Systems: As per HPI otherwise 10 point review of systems negative.    Past Medical History  Diagnosis Date  . Peptic ulcer   . Gastritis     Past Surgical History  Procedure Laterality Date  .  Cholecystectomy       reports that he has been smoking.  He does not have any smokeless tobacco history on file. He reports that he drinks alcohol. He reports that he uses illicit drugs (Marijuana).  Allergies  Allergen Reactions  . Haldol [Haloperidol] Swelling    Oral swelling  . Thorazine [Chlorpromazine]   . Toradol [Ketorolac Tromethamine]   . Tramadol   . Trazodone And Nefazodone   . Penicillins Hives and Rash    Has patient had a PCN reaction causing immediate rash, facial/tongue/throat swelling, SOB or lightheadedness with hypotension: No Has patient had a PCN reaction causing severe rash involving mucus membranes or skin necrosis: No Has patient had a PCN reaction that required hospitalization No Has patient had a PCN reaction occurring within the last 10 years: No If all of the above answers are "NO", then may proceed with Cephalosporin use.     History reviewed. No pertinent family history.  Prior to Admission medications   Medication Sig Start Date End Date Taking? Authorizing Provider  famotidine (PEPCID) 20 MG tablet Take 1 tablet (20 mg total) by mouth 2 (two) times daily. Patient not taking: Reported on 04/18/2015 04/17/15   Eber Hong, MD  pantoprazole (PROTONIX) 20 MG tablet Take 1 tablet (20 mg total) by mouth daily. Patient not taking: Reported on 04/18/2015 04/16/15   Geoffery Lyons, MD  promethazine (PHENERGAN) 25 MG suppository Place 1 suppository (25 mg total) rectally every 6 (six) hours as needed for nausea or vomiting. Patient not taking: Reported on 04/18/2015 04/17/15   Eber Hong, MD  promethazine (PHENERGAN) 25 MG tablet Take 1 tablet (25 mg total) by mouth every  6 (six) hours as needed for nausea. Patient not taking: Reported on 04/18/2015 04/16/15   Geoffery Lyons, MD  promethazine (PHENERGAN) 25 MG tablet Take 1 tablet (25 mg total) by mouth every 6 (six) hours as needed for nausea or vomiting. Patient not taking: Reported on 04/18/2015 04/17/15   Eber Hong, MD    Physical Exam: Filed Vitals:   11/23/15 2233 11/24/15 0110  BP: 157/96 155/94  Pulse: 87 67  Temp: 97.5 F (36.4 C) 97.7 F (36.5 C)  TempSrc: Oral Oral  Resp: 22 19  SpO2: 100% 98%      Constitutional:Unkept male, who appears uncomfortable, and unable to stay still. Filed Vitals:   11/23/15 2233 11/24/15 0110  BP: 157/96 155/94  Pulse: 87 67  Temp: 97.5 F (36.4 C) 97.7 F (36.5 C)  TempSrc: Oral Oral  Resp: 22 19  SpO2: 100% 98%   Eyes: PERRL, lids and conjunctivae normal ENMT: Mucous membranes are moist. Posterior pharynx clear of any exudate or lesions.Normal dentition.  Neck: normal, supple, no masses, no thyromegaly Respiratory: clear to auscultation bilaterally, no wheezing, no crackles. Normal respiratory effort. No accessory muscle use.  Cardiovascular: Regular rate and rhythm with systolic ejection murmur at its least 2-3 out of 6, no extremity edema. 2+ pedal pulses. No carotid bruits.  Abdomen: Mild epigastric tenderness no masses palpated. No hepatosplenomegaly. Bowel sounds positive.  Musculoskeletal: no clubbing / cyanosis. No joint deformity upper and lower extremities. Good ROM, no contractures. Normal muscle tone.  Skin: no rashes, lesions, ulcers. No induration Neurologic: CN 2-12 grossly intact. Sensation intact, DTR normal. Strength 5/5 in all 4.  Psychiatric: Poor judgment and insight. Alert and oriented x 3. Normal mood.     Labs on Admission: I have personally reviewed following labs and imaging studies  CBC:  Recent Labs Lab 11/23/15 2240  WBC 25.0*  HGB 17.7*  HCT 48.6  MCV 82.8  PLT 242   Basic Metabolic Panel:  Recent Labs Lab 11/23/15 2240  NA 144  K 3.6  CL 109  CO2 20*  GLUCOSE 181*  BUN 16  CREATININE 1.35*  CALCIUM 9.5   GFR: CrCl cannot be calculated (Unknown ideal weight.). Liver Function Tests:  Recent Labs Lab 11/23/15 2240  AST 26  ALT 16*  ALKPHOS 52  BILITOT 0.9  PROT 6.9  ALBUMIN  4.6    Recent Labs Lab 11/23/15 2240  LIPASE 91*   No results for input(s): AMMONIA in the last 168 hours. Coagulation Profile: No results for input(s): INR, PROTIME in the last 168 hours. Cardiac Enzymes: No results for input(s): CKTOTAL, CKMB, CKMBINDEX, TROPONINI in the last 168 hours. BNP (last 3 results) No results for input(s): PROBNP in the last 8760 hours. HbA1C: No results for input(s): HGBA1C in the last 72 hours. CBG: No results for input(s): GLUCAP in the last 168 hours. Lipid Profile: No results for input(s): CHOL, HDL, LDLCALC, TRIG, CHOLHDL, LDLDIRECT in the last 72 hours. Thyroid Function Tests: No results for input(s): TSH, T4TOTAL, FREET4, T3FREE, THYROIDAB in the last 72 hours. Anemia Panel: No results for input(s): VITAMINB12, FOLATE, FERRITIN, TIBC, IRON, RETICCTPCT in the last 72 hours. Urine analysis:    Component Value Date/Time   COLORURINE YELLOW 11/24/2015 0231   APPEARANCEUR CLEAR 11/24/2015 0231   LABSPEC >1.046* 11/24/2015 0231   PHURINE 7.0 11/24/2015 0231   GLUCOSEU NEGATIVE 11/24/2015 0231   HGBUR NEGATIVE 11/24/2015 0231   BILIRUBINUR NEGATIVE 11/24/2015 0231   KETONESUR >80* 11/24/2015 0231  PROTEINUR NEGATIVE 11/24/2015 0231   UROBILINOGEN 0.2 04/18/2015 0730   NITRITE NEGATIVE 11/24/2015 0231   LEUKOCYTESUR NEGATIVE 11/24/2015 0231   Sepsis Labs: @LABRCNTIP (procalcitonin:4,lacticidven:4) )No results found for this or any previous visit (from the past 240 hour(s)).   Radiological Exams on Admission: Ct Abdomen Pelvis W Contrast  11/24/2015  CLINICAL DATA:  25 year old male with history of ulcer and gastritis presenting with nausea vomiting. EXAM: CT ABDOMEN AND PELVIS WITH CONTRAST TECHNIQUE: Multidetector CT imaging of the abdomen and pelvis was performed using the standard protocol following bolus administration of intravenous contrast. CONTRAST:  100mL ISOVUE-300 IOPAMIDOL (ISOVUE-300) INJECTION 61% COMPARISON:  Ultrasound dated  04/17/2015 and CT dated 04/16/2015 FINDINGS: Evaluation is limited due to respiratory motion artifact. The visualized lung bases are clear. Calcified posterior mediastinal granuloma partially visualized. No intra-abdominal free air or free fluid. Cholecystectomy. The liver, pancreas, spleen, adrenal glands, kidneys, visualized ureters, and urinary bladder appear unremarkable. The prostate and seminal vesicles are grossly unremarkable. Evaluation of the bowel is limited in the absence of oral contrast. There is no evidence of bowel obstruction or active inflammation. Normal appendix. The abdominal aorta and IVC appear unremarkable. No portal venous gas identified. There is no adenopathy. There is diastases of anterior abdominal wall musculature with a small fat containing umbilical hernia. The abdominal wall soft tissues are otherwise unremarkable. The osseous structures are intact. IMPRESSION: No acute intra-abdominal or pelvic pathology. Electronically Signed   By: Elgie CollardArash  Radparvar M.D.   On: 11/24/2015 01:56     Assessment/Plan Nausea and vomiting: Suspect patient's acute symptoms likely from withdrawal - Admit to MedSurg bed - Zofran/Compazine prn N/V - IV fluids normal saline at 125 ml/hour - NPO with orders for nursing to advance diet as tolerated to regular diet  Epigastric abdominal pain with Elevated lipase: Lipase initially noted to be 91, but no acute signs of pancreatitis seen on CT. - IV fluids as seen above  Polysubstance abuse with possible withdrawals: Patient admitted to recent cocaine and marijuana use recently suspect that he could be currently withdrawing. - Checking urine drug screen - Check CK question possibility of underlying rhabdomyolysis given recent drug use - Ativan prn for agitation/anxiety - Social work consult for drug abuse  New systolic ejection murmur: Systolic ejection murmur - Check echocardiogram    Leukocytosis: Acute. WBC elevated at 25. Question this is  just reactive - Follow-up repeat CBC, GC probe - check lactic acid  - May want to consider obtaining blood cultures given questionable history of giving plasma  Shortness of breath: O2 sats seemed to be maintained on room air - Check stat chest x-ray not performed while in the ED  - Oxygen as needed to keep O2 saturations greater than 92%   Acute kidney injury:  Patient's previous creatinine noted to be within normal limits now acutely elevated 1.35 with a BUN of 16. Suspect likely related to patient's acute nausea vomiting symptoms. - Follow-up BMP in a.m. - IV fluids as seen above   GERD - Protonix   DVT prophylaxis: Lovenox Code Status: Full Family Communication:  None Disposition Plan: Likely discharge home tomorrow Consults called: None Admission status: Observation   Miguel Braunondell A Smith MD [facility] Pager [phone]  If 7PM-7AM, please contact night-coverage [email or web address] Password [email or web address]  11/24/2015, 3:09 AM

## 2015-11-24 NOTE — ED Provider Notes (Signed)
CSN: [id number]     Arrival date & time 11/23/15  2231 History   First MD Initiated Contact with Patient 11/23/15 2353     Chief Complaint  Patient presents with  . Emesis  . Nausea   HPI  Miguel Roman is a 25 y.o. male PMH significant for peptic ulcer, gastritis presenting with a 1 day history of nausea and vomiting. He states his vomited continuously all-day today (nonbloody, sometimes bilious). He endorses diffuse abdominal pain, non-radiating, 5/10 pain scale, constant, worse with vomiting. He admits to smoking marijuana today. He denies fevers, chills, CP, SOB, diarrhea, recent travel, raw/undercooked foods, hematemesis, ETOH use.   Past Medical History  Diagnosis Date  . Peptic ulcer   . Gastritis    Past Surgical History  Procedure Laterality Date  . Cholecystectomy     History reviewed. No pertinent family history. Social History  Substance Use Topics  . Smoking status: Current Every Day Smoker -- 1.00 packs/day  . Smokeless tobacco: None  . Alcohol Use: Yes    Review of Systems  Ten systems are reviewed and are negative for acute change except as noted in the HPI  Allergies  Haldol; Thorazine; Toradol; Tramadol; Trazodone and nefazodone; and Penicillins  Home Medications   Prior to Admission medications   Medication Sig Start Date End Date Taking? Authorizing Provider  famotidine (PEPCID) 20 MG tablet Take 1 tablet (20 mg total) by mouth 2 (two) times daily. Patient not taking: Reported on 04/18/2015 04/17/15   Eber HongBrian Miller, MD  pantoprazole (PROTONIX) 20 MG tablet Take 1 tablet (20 mg total) by mouth daily. Patient not taking: Reported on 04/18/2015 04/16/15   Geoffery Lyonsouglas Delo, MD  promethazine (PHENERGAN) 25 MG suppository Place 1 suppository (25 mg total) rectally every 6 (six) hours as needed for nausea or vomiting. Patient not taking: Reported on 04/18/2015 04/17/15   Eber HongBrian Miller, MD  promethazine (PHENERGAN) 25 MG tablet Take 1 tablet (25 mg total) by mouth every  6 (six) hours as needed for nausea. Patient not taking: Reported on 04/18/2015 04/16/15   Geoffery Lyonsouglas Delo, MD  promethazine (PHENERGAN) 25 MG tablet Take 1 tablet (25 mg total) by mouth every 6 (six) hours as needed for nausea or vomiting. Patient not taking: Reported on 04/18/2015 04/17/15   Eber HongBrian Miller, MD   BP 155/94 mmHg  Pulse 67  Temp(Src) 97.7 F (36.5 C) (Oral)  Resp 19  SpO2 98% Physical Exam  Constitutional: He appears well-developed and well-nourished. No distress.  Disheveled  HENT:  Head: Normocephalic and atraumatic.  Mouth/Throat: Oropharynx is clear and moist. No oropharyngeal exudate.  Eyes: Conjunctivae are normal. Pupils are equal, round, and reactive to light. Right eye exhibits no discharge. Left eye exhibits no discharge. No scleral icterus.  Neck: No tracheal deviation present.  Cardiovascular: Normal rate, regular rhythm, normal heart sounds and intact distal pulses.  Exam reveals no gallop and no friction rub.   No murmur heard. Pulmonary/Chest: Effort normal and breath sounds normal. No respiratory distress. He has no wheezes. He has no rales. He exhibits no tenderness.  Abdominal: Soft. Bowel sounds are normal. He exhibits no distension and no mass. There is tenderness. There is no rebound and no guarding.  LLQ tenderness  Musculoskeletal: He exhibits no edema.  House arrest bracelet on ankle.   Lymphadenopathy:    He has no cervical adenopathy.  Neurological: He is alert. Coordination normal.  Skin: Skin is warm and dry. No rash noted. He is not diaphoretic. No erythema.  Psychiatric:  Patient anxious appearing, dry heaving, sitting up in stretcher  Nursing note and vitals reviewed.  ED Course  Procedures  Labs Review Labs Reviewed  LIPASE, BLOOD - Abnormal; Notable for the following:    Lipase 91 (*)    All other components within normal limits  COMPREHENSIVE METABOLIC PANEL - Abnormal; Notable for the following:    CO2 20 (*)    Glucose, Bld 181 (*)     Creatinine, Ser 1.35 (*)    ALT 16 (*)    All other components within normal limits  CBC - Abnormal; Notable for the following:    WBC 25.0 (*)    RBC 5.87 (*)    Hemoglobin 17.7 (*)    MCHC 36.4 (*)    All other components within normal limits   Imaging Review Ct Abdomen Pelvis W Contrast  11/24/2015  CLINICAL DATA:  25 year old male with history of ulcer and gastritis presenting with nausea vomiting. EXAM: CT ABDOMEN AND PELVIS WITH CONTRAST TECHNIQUE: Multidetector CT imaging of the abdomen and pelvis was performed using the standard protocol following bolus administration of intravenous contrast. CONTRAST:  ISOVUE-300 IOPAMIDOL (ISOVUE-300) INJECTION 61% COMPARISON:  Ultrasound dated 04/17/2015 and CT dated 04/16/2015 FINDINGS: Evaluation is limited due to respiratory motion artifact. The visualized lung bases are clear. Calcified posterior mediastinal granuloma partially visualized. No intra-abdominal free air or free fluid. Cholecystectomy. The liver, pancreas, spleen, adrenal glands, kidneys, visualized ureters, and urinary bladder appear unremarkable. The prostate and seminal vesicles are grossly unremarkable. Evaluation of the bowel is limited in the absence of oral contrast. There is no evidence of bowel obstruction or active inflammation. Normal appendix. The abdominal aorta and IVC appear unremarkable. No portal venous gas identified. There is no adenopathy. There is diastases of anterior abdominal wall musculature with a small fat containing umbilical hernia. The abdominal wall soft tissues are otherwise unremarkable. The osseous structures are intact. IMPRESSION: No acute intra-abdominal or pelvic pathology. Electronically Signed   By: Elgie Collard M.D.   On: 11/24/2015 01:56   I have personally reviewed and evaluated these images and lab results as part of my medical decision-making.  MDM   Final diagnoses:  Nausea and vomiting, vomiting of unspecified type   Patient  nontoxic appearing, VSS. Based on patient history and physical exam, most likely etiologies include gastritis. Less likely etiologies include early appendicitis, mesenteric ischemia, peritonitis, AAA, SBO, large bowel obstruction/volvulus, SBP, IBD, colitis, DKA, IBS, food allergy.  Lipase 91, leukocytosis of 25, SCr of 1.35. Will CT abdomen/pelv for tenderness and leukocytosis.  CT abd/pelv negative for acute change.  UA demonstrates ketonuria and high specific gravity, likely result of dehydration; otherwise unremarkable.  Upon reassessment, patient still appears uncomfortable. Patient is still nauseated and dry heaving. I doubt he will follow-up and he does not have a PCP.   Hospitalist advises obs med-surg and UDS. Patient in understanding and agreement with the plan.    Melton Krebs, PA-C 11/24/15 0310  Dione Booze, MD 11/24/15 [phone]

## 2015-11-24 NOTE — Progress Notes (Signed)
Received patient, Miguel Roman to room 1334 from ED, AAOx4  With no acute distress noted. Vss. MD paged as ordered per patient arrived to floor. [facility] for teaching and assessments. Will continue to monitor and assess patient needs and concerns.

## 2015-11-24 NOTE — Progress Notes (Addendum)
PROGRESS NOTE    Miguel Roman  WUJ:[id number] DOB: 1991/06/21 DOA: 11/23/2015 PCP: No primary care provider on file.  Outpatient Specialists:     Brief Narrative:  25 y.o. male with medical history significant of gastritis, PUD, and polysubstance abuse (including cocaine, marijuana, barbiturates, tobacco, alcohol); who presents with complaints of intractable nausea and vomiting since the day prior to admission   Assessment & Plan:   Principal Problem:   Nausea and vomiting Active Problems:   Abdominal pain, epigastric   Polysubstance abuse   Acute kidney injury (HCC)   Leukocytosis   Shortness of breath   Elevated lipase   Undiagnosed cardiac murmurs   Nausea and vomiting:  - Admitted to MedSurg bed - Zofran/Compazine prn N/V - Continue IV fluids as tolerated  - NPO for now for bowel rest - UDS still pending. Symptoms may be related to active polysubstance drug abuse. Cannabis hyperemesis syndrome? - Also, pt noted to have hx of peptic ulcer disease reportedly [facility]. Will request endoscopy results - For now, cont PPI, carafate, PRN GI cocktail - Consider GI consult in AM  Epigastric abdominal pain with Elevated lipase: Lipase initially noted to be 91, but no acute signs of pancreatitis seen on CT. - IV fluids as per above  Polysubstance abuse: Patient admitted to recent cocaine and marijuana use recently - Pending urine drug screen - Ativan prn for agitation/anxiety - Social work consult for drug abuse  New systolic ejection murmur: Systolic ejection murmur - Check echocardiogram, pending  Leukocytosis: Acute. Presenting WBC elevated at 25. - WBC has trended down overnight to 19k - Will obtain blood cultures - r/o endocarditis  Shortness of breath: O2 sats seemed to be maintained on room air - Check stat chest x-ray not performed while in the ED  - Oxygen as needed to keep O2 saturations  greater than 92%   Acute kidney injury: Patient's previous creatinine noted to be within normal limits. Presented with Cr of 1.35 with a BUN of 16. - Improved with IVF  GERD - Protonix  - added carafate   DVT prophylaxis: Lovenox subQ Code Status: full Family Communication: Pt in room Disposition Plan: Uncertain   Consultants:     Procedures:     Antimicrobials:       Subjective: Complains of continued nausea  Objective: Filed Vitals:   11/24/15 0110 11/24/15 0340 11/24/15 0415 11/24/15 1309  BP: 155/94 151/95 126/81 109/62  Pulse: 67 74 88 70  Temp: 97.7 F (36.5 C)  97.8 F (36.6 C) 97.9 F (36.6 C)  TempSrc: Oral  Oral Oral  Resp: 19 18 19 16   Height:   6' (1.829 m)   Weight:   87.544 kg (193 lb)   SpO2: 98% 100% 100% 100%   No intake or output data in the 24 hours ending 11/24/15 1648 Filed Weights   11/24/15 0415  Weight: 87.544 kg (193 lb)    Examination:  General exam: Appears uncomfortable, laying in bed  Respiratory system: Clear to auscultation. Respiratory effort normal. Cardiovascular system: S1 & S2 heard, RRR.  Gastrointestinal system: Abdomen with epigastric tenderness, pos BS Central nervous system: Alert and oriented. No focal neurological deficits. Extremities: Symmetric 5 x 5 power. Skin: No rashes, lesions or ulcers Psychiatry: Judgement poor and insight appear normal. Mood & affect appropriate.   Data Reviewed: I personally reviewed lab data  CBC:  Recent Labs Lab 11/23/15 2240 11/24/15 0539  WBC 25.0* 19.3*  HGB 17.7* 15.5  HCT 48.6 44.6  MCV 82.8 84.6  PLT 242 221   Basic Metabolic Panel:  Recent Labs Lab 11/23/15 2240 11/24/15 0539  NA 144 144  K 3.6 3.9  CL 109 111  CO2 20* 23  GLUCOSE 181* 156*  BUN 16 15  CREATININE 1.35* 1.18  CALCIUM 9.5 8.6*   GFR: Estimated Creatinine Clearance: 104.1 mL/min (by C-G formula based on Cr of 1.18). Liver Function Tests:  Recent Labs Lab 11/23/15 2240  AST  26  ALT 16*  ALKPHOS 52  BILITOT 0.9  PROT 6.9  ALBUMIN 4.6    Recent Labs Lab 11/23/15 2240  LIPASE 91*   No results for input(s): AMMONIA in the last 168 hours. Coagulation Profile: No results for input(s): INR, PROTIME in the last 168 hours. Cardiac Enzymes:  Recent Labs Lab 11/24/15 0539  CKTOTAL 150   BNP (last 3 results) No results for input(s): PROBNP in the last 8760 hours. HbA1C: No results for input(s): HGBA1C in the last 72 hours. CBG: No results for input(s): GLUCAP in the last 168 hours. Lipid Profile: No results for input(s): CHOL, HDL, LDLCALC, TRIG, CHOLHDL, LDLDIRECT in the last 72 hours. Thyroid Function Tests: No results for input(s): TSH, T4TOTAL, FREET4, T3FREE, THYROIDAB in the last 72 hours. Anemia Panel: No results for input(s): VITAMINB12, FOLATE, FERRITIN, TIBC, IRON, RETICCTPCT in the last 72 hours. Urine analysis:    Component Value Date/Time   COLORURINE YELLOW 11/24/2015 0231   APPEARANCEUR CLEAR 11/24/2015 0231   LABSPEC >1.046* 11/24/2015 0231   PHURINE 7.0 11/24/2015 0231   GLUCOSEU NEGATIVE 11/24/2015 0231   HGBUR NEGATIVE 11/24/2015 0231   BILIRUBINUR NEGATIVE 11/24/2015 0231   KETONESUR >80* 11/24/2015 0231   PROTEINUR NEGATIVE 11/24/2015 0231   UROBILINOGEN 0.2 04/18/2015 0730   NITRITE NEGATIVE 11/24/2015 0231   LEUKOCYTESUR NEGATIVE 11/24/2015 0231   Sepsis Labs:  Recent Labs Lab 11/24/15 0539  LATICACIDVEN 2.0    No results found for this or any previous visit (from the past 240 hour(s)).       Radiology Studies: Ct Abdomen Pelvis W Contrast  11/24/2015  CLINICAL DATA:  25 year old male with history of ulcer and gastritis presenting with nausea vomiting. EXAM: CT ABDOMEN AND PELVIS WITH CONTRAST TECHNIQUE: Multidetector CT imaging of the abdomen and pelvis was performed using the standard protocol following bolus administration of intravenous contrast. CONTRAST:  ISOVUE-300 IOPAMIDOL (ISOVUE-300)  INJECTION 61% COMPARISON:  Ultrasound dated 04/17/2015 and CT dated 04/16/2015 FINDINGS: Evaluation is limited due to respiratory motion artifact. The visualized lung bases are clear. Calcified posterior mediastinal granuloma partially visualized. No intra-abdominal free air or free fluid. Cholecystectomy. The liver, pancreas, spleen, adrenal glands, kidneys, visualized ureters, and urinary bladder appear unremarkable. The prostate and seminal vesicles are grossly unremarkable. Evaluation of the bowel is limited in the absence of oral contrast. There is no evidence of bowel obstruction or active inflammation. Normal appendix. The abdominal aorta and IVC appear unremarkable. No portal venous gas identified. There is no adenopathy. There is diastases of anterior abdominal wall musculature with a small fat containing umbilical hernia. The abdominal wall soft tissues are otherwise unremarkable. The osseous structures are intact. IMPRESSION: No acute intra-abdominal or pelvic pathology. Electronically Signed   By: Elgie Collard M.D.   On: 11/24/2015 01:56   Dg Chest Port 1 View  11/24/2015  CLINICAL DATA:  Shortness of breath today.  Initial encounter. EXAM: PORTABLE CHEST 1 VIEW COMPARISON:  CT abdomen and pelvis is same  day and 04/16/2015. FINDINGS: The costophrenic angles are just off the inferior margins of the film but are well seen on the CT scans. The lungs are clear. No pneumothorax or pleural effusion. Heart size is normal. Two radiopaque densities projecting in the soft tissues of the left upper chest are seen on both prior CT scans and may be due to prior trauma. IMPRESSION: No acute disease. Electronically Signed   By: Drusilla Kanner M.D.   On: 11/24/2015 08:45        Scheduled Meds: . enoxaparin (LOVENOX) injection  40 mg Subcutaneous Q24H  . pantoprazole (PROTONIX) IV  40 mg Intravenous BID  . sucralfate  1 g Oral TID WC & HS   Continuous Infusions: . sodium chloride 125 mL/hr at  11/24/15 1416        Hatsuko Bizzarro, Scheryl Marten, MD [facility] Pager [phone]  If 7PM-7AM, please contact night-coverage [email or web address] Password [facility] 11/24/2015, 4:48 PM

## 2015-11-25 ENCOUNTER — Observation Stay (HOSPITAL_COMMUNITY): Payer: MEDICAID

## 2015-11-25 LAB — HEMOGLOBIN A1C
Hgb A1c MFr Bld: 5.4 % (ref 4.8–5.6)
MEAN PLASMA GLUCOSE: 108 mg/dL

## 2015-11-25 LAB — CBC
HEMATOCRIT: 45.1 % (ref 39.0–52.0)
Hemoglobin: 15.4 g/dL (ref 13.0–17.0)
MCH: 29.2 pg (ref 26.0–34.0)
MCHC: 34.1 g/dL (ref 30.0–36.0)
MCV: 85.6 fL (ref 78.0–100.0)
PLATELETS: 239 10*3/uL (ref 150–400)
RBC: 5.27 MIL/uL (ref 4.22–5.81)
RDW: 13 % (ref 11.5–15.5)
WBC: 15 10*3/uL — AB (ref 4.0–10.5)

## 2015-11-25 MED ORDER — MORPHINE SULFATE (PF) 2 MG/ML IV SOLN
0.5000 mg | Freq: Once | INTRAVENOUS | Status: AC
  Administered 2015-11-25: 0.5 mg via INTRAVENOUS
  Filled 2015-11-25: qty 1

## 2015-11-25 MED ORDER — MORPHINE SULFATE (PF) 2 MG/ML IV SOLN
2.0000 mg | Freq: Once | INTRAVENOUS | Status: AC
  Administered 2015-11-25: 2 mg via INTRAVENOUS
  Filled 2015-11-25: qty 1

## 2015-11-25 NOTE — Progress Notes (Signed)
CSW consulted to assist with SA resources. Pt experiencing nausea and vomiting. CSW will return when pt's symptoms subside.   Cori RazorJamie Jquan Egelston LCSW [phone]

## 2015-11-25 NOTE — Progress Notes (Signed)
Patient continues to disconnect himself from IV fluids to go into restroom to take numerous showers. IV fluids currently off due to patient continuously turns fluids off against nurses advice.

## 2015-11-25 NOTE — CV Procedure (Signed)
Patient too nauseous to complete echocardiogram at this time, I will try again tomorrow.  Leta Junglingiffany Emerie Vanderkolk [facility] 11/25/15 11:20 AM

## 2015-11-25 NOTE — Consult Note (Signed)
Consult Note for [facility]  Reason for Consult: Nausea, vomiting, and abdominal pain Referring Physician: [facility]  Rollan Osmer HPI: This is a 25 year old male with a PMH of polysubstance abuse and gastritis admitted for recurrent nausea, vomiting, and abdominal pain.  He was in the [facility] in 04/2015 for the same symptoms and treated symptomatically.  At that time he was positive for THC in the urine as well as cocaine.  From December 2016 until March 2017 he was incarcerated and his symptoms resolved as he did not have access to marijuana.  Upon his release he returned to using daily marijuana and then he started to develop the abdominal pain, nausea, and vomiting.  To help control his symptoms he takes frequent hot showers.  Past Medical History  Diagnosis Date  . Peptic ulcer   . Gastritis     Past Surgical History  Procedure Laterality Date  . Cholecystectomy      History reviewed. No pertinent family history.  Social History:  reports that he has been smoking.  He does not have any smokeless tobacco history on file. He reports that he drinks alcohol. He reports that he uses illicit drugs (Marijuana).  Allergies:  Allergies  Allergen Reactions  . Haldol [Haloperidol] Swelling    Oral swelling  . Thorazine [Chlorpromazine]   . Toradol [Ketorolac Tromethamine]   . Tramadol   . Trazodone And Nefazodone   . Penicillins Hives and Rash    Has patient had a PCN reaction causing immediate rash, facial/tongue/throat swelling, SOB or lightheadedness with hypotension: No Has patient had a PCN reaction causing severe rash involving mucus membranes or skin necrosis: No Has patient had a PCN reaction that required hospitalization No Has patient had a PCN reaction occurring within the last 10 years: No If all of the above answers are "NO", then may proceed with Cephalosporin use.     Medications:  Scheduled: . antiseptic oral rinse  7 mL Mouth Rinse BID  .  enoxaparin (LOVENOX) injection  40 mg Subcutaneous Q24H  . pantoprazole (PROTONIX) IV  40 mg Intravenous BID  . sucralfate  1 g Oral TID WC & HS   Continuous: . sodium chloride Stopped (11/25/15 0824)    Results for orders placed or performed during the hospital encounter of 11/23/15 (from the past 24 hour(s))  CBC     Status: Abnormal   Collection Time: 11/25/15  3:30 AM  Result Value Ref Range   WBC 15.0 (H) 4.0 - 10.5 K/uL   RBC 5.27 4.22 - 5.81 MIL/uL   Hemoglobin 15.4 13.0 - 17.0 g/dL   HCT 16.1 09.6 - 04.5 %   MCV 85.6 78.0 - 100.0 fL   MCH 29.2 26.0 - 34.0 pg   MCHC 34.1 30.0 - 36.0 g/dL   RDW 40.9 81.1 - 91.4 %   Platelets 239 150 - 400 K/uL     Ct Abdomen Pelvis W Contrast  11/24/2015  CLINICAL DATA:  25 year old male with history of ulcer and gastritis presenting with nausea vomiting. EXAM: CT ABDOMEN AND PELVIS WITH CONTRAST TECHNIQUE: Multidetector CT imaging of the abdomen and pelvis was performed using the standard protocol following bolus administration of intravenous contrast. CONTRAST:  ISOVUE-300 IOPAMIDOL (ISOVUE-300) INJECTION 61% COMPARISON:  Ultrasound dated 04/17/2015 and CT dated 04/16/2015 FINDINGS: Evaluation is limited due to respiratory motion artifact. The visualized lung bases are clear. Calcified posterior mediastinal granuloma partially visualized. No intra-abdominal free air or free fluid. Cholecystectomy. The liver,  pancreas, spleen, adrenal glands, kidneys, visualized ureters, and urinary bladder appear unremarkable. The prostate and seminal vesicles are grossly unremarkable. Evaluation of the bowel is limited in the absence of oral contrast. There is no evidence of bowel obstruction or active inflammation. Normal appendix. The abdominal aorta and IVC appear unremarkable. No portal venous gas identified. There is no adenopathy. There is diastases of anterior abdominal wall musculature with a small fat containing umbilical hernia. The abdominal wall  soft tissues are otherwise unremarkable. The osseous structures are intact. IMPRESSION: No acute intra-abdominal or pelvic pathology. Electronically Signed   By: Elgie CollardArash  Radparvar M.D.   On: 11/24/2015 01:56   Dg Chest Port 1 View  11/24/2015  CLINICAL DATA:  Shortness of breath today.  Initial encounter. EXAM: PORTABLE CHEST 1 VIEW COMPARISON:  CT abdomen and pelvis is same day and 04/16/2015. FINDINGS: The costophrenic angles are just off the inferior margins of the film but are well seen on the CT scans. The lungs are clear. No pneumothorax or pleural effusion. Heart size is normal. Two radiopaque densities projecting in the soft tissues of the left upper chest are seen on both prior CT scans and may be due to prior trauma. IMPRESSION: No acute disease. Electronically Signed   By: Drusilla Kannerhomas  Dalessio M.D.   On: 11/24/2015 08:45    ROS:  As stated above in the HPI otherwise negative.  Blood pressure 172/97, pulse 82, temperature 98 F (36.7 C), temperature source Oral, resp. rate 18, height 6' (1.829 m), weight 87.544 kg (193 lb), SpO2 96 %.    PE: Gen: NAD, Alert and Oriented HEENT:  [place]/AT, EOMI Neck: Supple, no LAD Lungs: CTA Bilaterally CV: RRR without M/G/R ABM: Soft, NTND, +BS Ext: No C/C/E  Assessment/Plan: 1) Canniboid hyperemesis syndrome. 2) Polysubstance abuse.   The patient has Canniboid hyperemesis syndrome and it will resolve with cessation of marijuana.  He craves pain medication, but he should not receive any narcotic pain medications as he is an addict.  The patient lacks any insight to his problems.  He states that he took cocaine to help control his pain as a result of the self-induced symptoms from marijuana.  Plan: 1) Stop marijuana use. 2) Frequent hot showers. 3) No narcotic pain medications. 4) Signing off.  Kholton Coate D 11/25/2015, 6:37 PM

## 2015-11-25 NOTE — Progress Notes (Signed)
PROGRESS NOTE    Miguel Roman  JXB:[id number] DOB: 12/23/1990 DOA: 11/23/2015 PCP: No primary care provider on file.  Outpatient Specialists:     Brief Narrative:  25 y.o. male with medical history significant of gastritis, PUD, and polysubstance abuse (including cocaine, marijuana, barbiturates, tobacco, alcohol); who presents with complaints of intractable nausea and vomiting since the day prior to admission   Assessment & Plan:   Principal Problem:   Nausea and vomiting Active Problems:   Abdominal pain, epigastric   Polysubstance abuse   Acute kidney injury (HCC)   Leukocytosis   Shortness of breath   Elevated lipase   Undiagnosed cardiac murmurs   Nausea and vomiting:  - Admitted to MedSurg bed - Zofran/Compazine prn N/V - Continue IV fluids as tolerated  - NPO for now for bowel rest - UDS still pending. Symptoms may be related to active polysubstance drug abuse. Cannabis hyperemesis syndrome? - Also, pt noted to have hx of peptic ulcer disease reportedly confirmed by [facility]. Endo reported multiple superficial ulcers - For now, cont PPI, carafate, PRN GI cocktail - Patient remains symptomatic, requesting narcotics. Will consult GI for further recs  Epigastric abdominal pain with Elevated lipase: Lipase initially noted to be 91, but no acute signs of pancreatitis seen on CT. - IV fluids as per above  Polysubstance abuse: Patient admitted to recent cocaine and marijuana use recently - Pending urine drug screen - Ativan prn for agitation/anxiety - Social work consult for drug abuse  New systolic ejection murmur: Systolic ejection murmur - Check echocardiogram, pending  Leukocytosis: Acute. Presenting WBC elevated at 25. - WBC has trended down overnight to 15k - Blood cultures obtained - Suspect reaction to polysubstance abuse  Shortness of breath: O2 sats seemed to be maintained on room air - Check stat  chest x-ray not performed while in the ED  - Oxygen as needed to keep O2 saturations greater than 92%   Acute kidney injury: Patient's previous creatinine noted to be within normal limits. Presented with Cr of 1.35 with a BUN of 16. - Improved with IVF  GERD - Protonix  - added carafate   DVT prophylaxis: Lovenox subQ Code Status: full Family Communication: Pt in room Disposition Plan: Possible d/c in 24-48hrs   Consultants:   GI  Procedures:     Antimicrobials:       Subjective: Still complaining of epigastric pain and nausea/vomiting  Objective: Filed Vitals:   11/24/15 0415 11/24/15 1309 11/24/15 2119 11/25/15 1500  BP: 126/81 109/62 128/50 172/97  Pulse: 88 70 77 82  Temp: 97.8 F (36.6 C) 97.9 F (36.6 C) 98 F (36.7 C)   TempSrc: Oral Oral Oral   Resp: 19 16 18 18   Height: 6' (1.829 m)     Weight: 87.544 kg (193 lb)     SpO2: 100% 100% 99% 96%    Intake/Output Summary (Last 24 hours) at 11/25/15 1755 Last data filed at 11/25/15 0800  Gross per 24 hour  Intake      0 ml  Output      0 ml  Net      0 ml   Filed Weights   11/24/15 0415  Weight: 87.544 kg (193 lb)    Examination:  General exam: Appears uncomfortable, ambulating into hallway Respiratory system: Clear to auscultation. Respiratory effort normal. Cardiovascular system: S1 & S2 heard, RRR.  Gastrointestinal system: Abdomen with epigastric tenderness, pos BS Central nervous system: Alert and  oriented. No focal neurological deficits. Extremities: Symmetric 5 x 5 power. Skin: No rashes, lesions or ulcers Psychiatry: Judgement poor and insight appear normal. Mood & affect appropriate.   Data Reviewed: I personally reviewed lab data  CBC:  Recent Labs Lab 11/23/15 2240 11/24/15 0539 11/25/15 0330  WBC 25.0* 19.3* 15.0*  HGB 17.7* 15.5 15.4  HCT 48.6 44.6 45.1  MCV 82.8 84.6 85.6  PLT 242 221 239   Basic Metabolic Panel:  Recent Labs Lab 11/23/15 2240 11/24/15 0539   NA 144 144  K 3.6 3.9  CL 109 111  CO2 20* 23  GLUCOSE 181* 156*  BUN 16 15  CREATININE 1.35* 1.18  CALCIUM 9.5 8.6*   GFR: Estimated Creatinine Clearance: 104.1 mL/min (by C-G formula based on Cr of 1.18). Liver Function Tests:  Recent Labs Lab 11/23/15 2240  AST 26  ALT 16*  ALKPHOS 52  BILITOT 0.9  PROT 6.9  ALBUMIN 4.6    Recent Labs Lab 11/23/15 2240  LIPASE 91*   No results for input(s): AMMONIA in the last 168 hours. Coagulation Profile: No results for input(s): INR, PROTIME in the last 168 hours. Cardiac Enzymes:  Recent Labs Lab 11/24/15 0539  CKTOTAL 150   BNP (last 3 results) No results for input(s): PROBNP in the last 8760 hours. HbA1C:  Recent Labs  11/24/15 0544  HGBA1C 5.4   CBG: No results for input(s): GLUCAP in the last 168 hours. Lipid Profile: No results for input(s): CHOL, HDL, LDLCALC, TRIG, CHOLHDL, LDLDIRECT in the last 72 hours. Thyroid Function Tests: No results for input(s): TSH, T4TOTAL, FREET4, T3FREE, THYROIDAB in the last 72 hours. Anemia Panel: No results for input(s): VITAMINB12, FOLATE, FERRITIN, TIBC, IRON, RETICCTPCT in the last 72 hours. Urine analysis:    Component Value Date/Time   COLORURINE YELLOW 11/24/2015 0231   APPEARANCEUR CLEAR 11/24/2015 0231   LABSPEC >1.046* 11/24/2015 0231   PHURINE 7.0 11/24/2015 0231   GLUCOSEU NEGATIVE 11/24/2015 0231   HGBUR NEGATIVE 11/24/2015 0231   BILIRUBINUR NEGATIVE 11/24/2015 0231   KETONESUR >80* 11/24/2015 0231   PROTEINUR NEGATIVE 11/24/2015 0231   UROBILINOGEN 0.2 04/18/2015 0730   NITRITE NEGATIVE 11/24/2015 0231   LEUKOCYTESUR NEGATIVE 11/24/2015 0231   Sepsis Labs:  Recent Labs Lab 11/24/15 0539  LATICACIDVEN 2.0    No results found for this or any previous visit (from the past 240 hour(s)).       Radiology Studies: Ct Abdomen Pelvis W Contrast  11/24/2015  CLINICAL DATA:  25 year old male with history of ulcer and gastritis presenting with  nausea vomiting. EXAM: CT ABDOMEN AND PELVIS WITH CONTRAST TECHNIQUE: Multidetector CT imaging of the abdomen and pelvis was performed using the standard protocol following bolus administration of intravenous contrast. CONTRAST:  ISOVUE-300 IOPAMIDOL (ISOVUE-300) INJECTION 61% COMPARISON:  Ultrasound dated 04/17/2015 and CT dated 04/16/2015 FINDINGS: Evaluation is limited due to respiratory motion artifact. The visualized lung bases are clear. Calcified posterior mediastinal granuloma partially visualized. No intra-abdominal free air or free fluid. Cholecystectomy. The liver, pancreas, spleen, adrenal glands, kidneys, visualized ureters, and urinary bladder appear unremarkable. The prostate and seminal vesicles are grossly unremarkable. Evaluation of the bowel is limited in the absence of oral contrast. There is no evidence of bowel obstruction or active inflammation. Normal appendix. The abdominal aorta and IVC appear unremarkable. No portal venous gas identified. There is no adenopathy. There is diastases of anterior abdominal wall musculature with a small fat containing umbilical hernia. The abdominal wall soft tissues are otherwise  unremarkable. The osseous structures are intact. IMPRESSION: No acute intra-abdominal or pelvic pathology. Electronically Signed   By: Elgie Collard M.D.   On: 11/24/2015 01:56   Dg Chest Port 1 View  11/24/2015  CLINICAL DATA:  Shortness of breath today.  Initial encounter. EXAM: PORTABLE CHEST 1 VIEW COMPARISON:  CT abdomen and pelvis is same day and 04/16/2015. FINDINGS: The costophrenic angles are just off the inferior margins of the film but are well seen on the CT scans. The lungs are clear. No pneumothorax or pleural effusion. Heart size is normal. Two radiopaque densities projecting in the soft tissues of the left upper chest are seen on both prior CT scans and may be due to prior trauma. IMPRESSION: No acute disease. Electronically Signed   By: Drusilla Kanner  M.D.   On: 11/24/2015 08:45        Scheduled Meds: . antiseptic oral rinse  7 mL Mouth Rinse BID  . enoxaparin (LOVENOX) injection  40 mg Subcutaneous Q24H  . pantoprazole (PROTONIX) IV  40 mg Intravenous BID  . sucralfate  1 g Oral TID WC & HS   Continuous Infusions: . sodium chloride Stopped (11/25/15 0824)        CHIU, Scheryl Marten, MD [facility] Pager [phone]  If 7PM-7AM, please contact night-coverage [email or web address] Password [facility] 11/25/2015, 5:55 PM

## 2015-11-26 ENCOUNTER — Inpatient Hospital Stay (HOSPITAL_COMMUNITY): Payer: MEDICAID

## 2015-11-26 DIAGNOSIS — I509 Heart failure, unspecified: Secondary | ICD-10-CM

## 2015-11-26 LAB — BLOOD CULTURE ID PANEL (REFLEXED)
Acinetobacter baumannii: NOT DETECTED
CANDIDA ALBICANS: NOT DETECTED
CANDIDA GLABRATA: NOT DETECTED
Candida krusei: NOT DETECTED
Candida parapsilosis: NOT DETECTED
Candida tropicalis: NOT DETECTED
Carbapenem resistance: NOT DETECTED
ENTEROBACTER CLOACAE COMPLEX: NOT DETECTED
ENTEROBACTERIACEAE SPECIES: NOT DETECTED
ENTEROCOCCUS SPECIES: NOT DETECTED
ESCHERICHIA COLI: NOT DETECTED
HAEMOPHILUS INFLUENZAE: NOT DETECTED
KLEBSIELLA PNEUMONIAE: NOT DETECTED
Klebsiella oxytoca: NOT DETECTED
Listeria monocytogenes: NOT DETECTED
METHICILLIN RESISTANCE: NOT DETECTED
Neisseria meningitidis: NOT DETECTED
PROTEUS SPECIES: NOT DETECTED
PSEUDOMONAS AERUGINOSA: NOT DETECTED
STAPHYLOCOCCUS AUREUS BCID: NOT DETECTED
STREPTOCOCCUS AGALACTIAE: NOT DETECTED
STREPTOCOCCUS PNEUMONIAE: NOT DETECTED
STREPTOCOCCUS PYOGENES: NOT DETECTED
STREPTOCOCCUS SPECIES: NOT DETECTED
Serratia marcescens: NOT DETECTED
Staphylococcus species: DETECTED — AB
VANCOMYCIN RESISTANCE: NOT DETECTED

## 2015-11-26 LAB — ECHOCARDIOGRAM COMPLETE
HEIGHTINCHES: 72 in
WEIGHTICAEL: 3088 [oz_av]

## 2015-11-26 MED ORDER — CEFAZOLIN SODIUM-DEXTROSE 2-4 GM/100ML-% IV SOLN
2.0000 g | Freq: Three times a day (TID) | INTRAVENOUS | Status: DC
  Administered 2015-11-26: 2 g via INTRAVENOUS
  Filled 2015-11-26 (×5): qty 100

## 2015-11-26 MED ORDER — VANCOMYCIN HCL IN DEXTROSE 1-5 GM/200ML-% IV SOLN: 1000.0000 mg | Freq: Three times a day (TID) | INTRAVENOUS | Status: DC

## 2015-11-26 NOTE — Progress Notes (Signed)
PHARMACY - PHYSICIAN COMMUNICATION CRITICAL VALUE ALERT - BLOOD CULTURE IDENTIFICATION (BCID)  BCID result received:  [ ]  Vancomycin resistant enterococcus (VRE) [ ]  Enterococcus spp (no resistance detected) [ ]  Listeria monocytogenes [ ]  Staphylococcus species (methicillin resistance detected) Arly.[email or web address][X ] Staphylococcus species (methicllin resistance NOT detected) [ ]  Methicillin-resistant Staphylococcus aureus (MRSA) [ ]  Methicillin-susceptible Staphylococcus aureus (MSSA) [ ]  Streptococcus agalactiae (Group B strep) [ ]  Streptococcus pneumoniae [ ]  Streptococcus pyogenes (Group A strep) [ ]  Streptococcus spp.  [ ]  Acinetobacter baumannii [ ]  Enterobacter cloacae [ ]  Enterobacter cloacae (Carbapenem resistance detected) [ ]  Escherichia coli [ ]  Escherichia coli (Carbapenem resistance detected) [ ]  Haemophilus influenza [ ]  Klebsiella oxytoca [ ]  Klebsiella oxytoca (Carbapenem resistance detected) [ ]  Klebsiella pneumoniae [ ]  Klebsiella pneumoniae (Carbapenem resistance detected) [ ]  Neisseria meningitidis [ ]  Proteus spp. [ ]  Proteus spp. (Carbapenem resistance detected) [ ]  Pseudomonas aeruginosa [ ]  Pseudomonas aeruginosa (Carbapenem resistance detected) [ ]  Serratia marcescens [ ]  Candida _______ albicans, glabrata, krusei, parapsilosis, tropicalis (fill in blank with correct species)  Name of physician (or Provider) Contacted:  K. Schorr  Changes to prescribed antibiotics required:  Change to Ancef 2gm iv q8hr  Aleene DavidsonGrimsley Jr, Nickalos Petersen Crowford 11/26/2015 6:05 AM

## 2015-11-26 NOTE — Progress Notes (Signed)
Pt Vomited and was wretching, medicated with ativan 1 mg. Pt slept for a while. When he woke up he was asking for pain medicine, told  he could have tylenol. Pt states he's afraid he will vomit again. He took seven showers this AM. He was told many times on nights not to touch his IV but he continually disconnected the IV and went into the shower. At about 1:55 Pt states He's leaving the hospital, instructed not to leave until his IV is dc'd. Pt came out of his room holding his IV in his hand- he had taken it out himself. AMA form was signed and Dr. Red Christianshui was called. Also [employer] called and informed he had left AMA and took the charger to his ankle bracelet, which he was suppose to leave here.

## 2015-11-26 NOTE — Clinical Social Work Note (Addendum)
CSW attempted to see patient for substance abuse consult, but patient left AMA before CSW was able to see patient.  Ervin KnackEric R. Janalyn Higby, MSW, Theresia MajorsLCSWA [phone] 11/26/2015 4:34 PM

## 2015-11-26 NOTE — Progress Notes (Signed)
  Echocardiogram 2D Echocardiogram has been performed.  Leta JunglingCooper, Jodean Valade M 11/26/2015, 7:59 AM

## 2015-11-26 NOTE — Discharge Summary (Signed)
Physician Discharge Summary  Miguel Roman RUE:[id number] DOB: 01/25/1991 DOA: 11/23/2015  PCP: No primary care provider on file.  Admit date: 11/23/2015 Discharge date: 11/26/2015  PATIENT LEFT AGAINST MEDICAL ADVISE  Discharge Diagnoses:  Principal Problem:   Nausea and vomiting Active Problems:   Abdominal pain, epigastric   Polysubstance abuse   Acute kidney injury (HCC)   Leukocytosis   Shortness of breath   Elevated lipase   Undiagnosed cardiac murmurs    Filed Weights   11/24/15 0415  Weight: 87.544 kg (193 lb)    History of present illness:  Please review dictated H and P from 5/11 for details. Briefly, 25 y.o. male with medical history significant of gastritis, PUD, and polysubstance abuse (including cocaine, marijuana, barbiturates, tobacco, alcohol); who presents with complaints of intractable nausea and vomiting since the day prior to admission  Hospital Course:   Nausea and vomiting:  - Admitted to MedSurg bed - Zofran/Compazine prn N/V - Continue IV fluids as tolerated  - NPO for now for bowel rest - UDS still pending. Symptoms may be related to active polysubstance drug abuse. Cannabis hyperemesis syndrome? - Also, pt noted to have hx of peptic ulcer disease reportedly confirmed by endoscopy in 7/16 by [facility]. Endo reported multiple superficial ulcers - For now, cont PPI, carafate, PRN GI cocktail - Patient remains symptomatic, requesting narcotics. GI consulted with recs to avoid marijuana and other drugs of abuse. Also recs to AVOID NARCOTICS  Epigastric abdominal pain with Elevated lipase: Lipase initially noted to be 91, but no acute signs of pancreatitis seen on CT. - IV fluids as per above  Polysubstance abuse: Patient admitted to recent cocaine and marijuana use recently - Pending urine drug screen - Ativan prn for agitation/anxiety - Social work consult for drug abuse  New systolic ejection murmur: Systolic  ejection murmur - Check echocardiogram, pending  Leukocytosis: Acute. Presenting WBC elevated at 25. - WBC has trended down without abx - Blood cultures were obtained, which did grow staph species - Patient was started on empiric ancef, awaiting speciation and sensitivities - Suspect leukocytosis is reaction to polysubstance abuse  Shortness of breath: O2 sats seemed to be maintained on room air  Acute kidney injury: Patient's previous creatinine noted to be within normal limits. Presented with Cr of 1.35 with a BUN of 16. - Improved with IVF  GERD - Protonix  - added carafate - Given PRN GI cocktails  On 5/14, while still awaiting blood culture results, patient elected to leave hospital against medical advise. Patient is aware of the risks of leaving which include further decompensation and even death. AMA paper was signed   Consultations:  GI  Discharge Exam: Filed Vitals:   11/25/15 1500 11/25/15 2136 11/26/15 0650 11/26/15 1325  BP: 172/97 154/98 129/80 139/94  Pulse: 82 95 68 89  Temp:  98.9 F (37.2 C) 97.8 F (36.6 C) 98.4 F (36.9 C)  TempSrc:  Oral Oral Oral  Resp: 18 20 20 18   Height:      Weight:      SpO2: 96% 100% 100% 100%    General: Awake, in nad Cardiovascular: regular, s1, s2 Respiratory: normal resp effort, no wheezing  Discharge Instructions     Medication List    ASK your doctor about these medications        famotidine 20 MG tablet  Commonly known as:  PEPCID  Take 1 tablet (20 mg total) by mouth 2 (two) times daily.  pantoprazole 20 MG tablet  Commonly known as:  PROTONIX  Take 1 tablet (20 mg total) by mouth daily.     promethazine 25 MG tablet  Commonly known as:  PHENERGAN  Take 1 tablet (25 mg total) by mouth every 6 (six) hours as needed for nausea.     promethazine 25 MG suppository  Commonly known as:  PHENERGAN  Place 1 suppository (25 mg total) rectally every 6 (six) hours as needed for nausea or vomiting.      promethazine 25 MG tablet  Commonly known as:  PHENERGAN  Take 1 tablet (25 mg total) by mouth every 6 (six) hours as needed for nausea or vomiting.       Allergies  Allergen Reactions  . Haldol [Haloperidol] Swelling    Oral swelling  . Thorazine [Chlorpromazine]   . Toradol [Ketorolac Tromethamine]   . Tramadol   . Trazodone And Nefazodone   . Penicillins Hives and Rash    Has patient had a PCN reaction causing immediate rash, facial/tongue/throat swelling, SOB or lightheadedness with hypotension: No Has patient had a PCN reaction causing severe rash involving mucus membranes or skin necrosis: No Has patient had a PCN reaction that required hospitalization No Has patient had a PCN reaction occurring within the last 10 years: No If all of the above answers are "NO", then may proceed with Cephalosporin use.       The results of significant diagnostics from this hospitalization (including imaging, microbiology, ancillary and laboratory) are listed below for reference.    Significant Diagnostic Studies: Ct Abdomen Pelvis W Contrast  11/24/2015  CLINICAL DATA:  25 year old male with history of ulcer and gastritis presenting with nausea vomiting. EXAM: CT ABDOMEN AND PELVIS WITH CONTRAST TECHNIQUE: Multidetector CT imaging of the abdomen and pelvis was performed using the standard protocol following bolus administration of intravenous contrast. CONTRAST:  ISOVUE-300 IOPAMIDOL (ISOVUE-300) INJECTION 61% COMPARISON:  Ultrasound dated 04/17/2015 and CT dated 04/16/2015 FINDINGS: Evaluation is limited due to respiratory motion artifact. The visualized lung bases are clear. Calcified posterior mediastinal granuloma partially visualized. No intra-abdominal free air or free fluid. Cholecystectomy. The liver, pancreas, spleen, adrenal glands, kidneys, visualized ureters, and urinary bladder appear unremarkable. The prostate and seminal vesicles are grossly unremarkable. Evaluation of the  bowel is limited in the absence of oral contrast. There is no evidence of bowel obstruction or active inflammation. Normal appendix. The abdominal aorta and IVC appear unremarkable. No portal venous gas identified. There is no adenopathy. There is diastases of anterior abdominal wall musculature with a small fat containing umbilical hernia. The abdominal wall soft tissues are otherwise unremarkable. The osseous structures are intact. IMPRESSION: No acute intra-abdominal or pelvic pathology. Electronically Signed   By: Elgie Collard M.D.   On: 11/24/2015 01:56   Dg Chest Port 1 View  11/24/2015  CLINICAL DATA:  Shortness of breath today.  Initial encounter. EXAM: PORTABLE CHEST 1 VIEW COMPARISON:  CT abdomen and pelvis is same day and 04/16/2015. FINDINGS: The costophrenic angles are just off the inferior margins of the film but are well seen on the CT scans. The lungs are clear. No pneumothorax or pleural effusion. Heart size is normal. Two radiopaque densities projecting in the soft tissues of the left upper chest are seen on both prior CT scans and may be due to prior trauma. IMPRESSION: No acute disease. Electronically Signed   By: Drusilla Kanner M.D.   On: 11/24/2015 08:45    Microbiology: Recent Results (from the  past 240 hour(s))  Culture, blood (routine x 2)     Status: None (Preliminary result)   Collection Time: 11/24/15  4:01 PM  Result Value Ref Range Status   Specimen Description RIGHT ANTECUBITAL  Final   Special Requests BOTTLES DRAWN AEROBIC AND ANAEROBIC 10CC  Final   Culture  Setup Time   Final    ANAEROBIC BOTTLE ONLY GRAM POSITIVE COCCI IN CLUSTERS CRITICAL RESULT CALLED TO, READ BACK BY AND VERIFIED WITH: TO JCLARKE(RN) BY TCLEVELAND 11/26/2015 AT 1:55AM Organism ID to follow    Culture   Final    CULTURE REINCUBATED FOR BETTER GROWTH Performed at [facility]    Report Status PENDING  Incomplete  Culture, blood (routine x 2)     Status: None (Preliminary result)    Collection Time: 11/24/15  4:01 PM  Result Value Ref Range Status   Specimen Description BLOOD BLOOD LEFT WRIST  Final   Special Requests IN PEDIATRIC BOTTLE 1CC  Final   Culture   Final    NO GROWTH 2 DAYS Performed at [facility]    Report Status PENDING  Incomplete  Blood Culture ID Panel (Reflexed)     Status: Abnormal   Collection Time: 11/24/15  4:01 PM  Result Value Ref Range Status   Enterococcus species NOT DETECTED NOT DETECTED Final   Vancomycin resistance NOT DETECTED NOT DETECTED Final   Listeria monocytogenes NOT DETECTED NOT DETECTED Final   Staphylococcus species DETECTED (A) NOT DETECTED Final    Comment: CRITICAL RESULT CALLED TO, READ BACK BY AND VERIFIED WITH: TO JGRIMSLEY(PHARD) BY TCLEVELAND 11/26/2015 AT 4:21AM    Staphylococcus aureus NOT DETECTED NOT DETECTED Final   Methicillin resistance NOT DETECTED NOT DETECTED Final   Streptococcus species NOT DETECTED NOT DETECTED Final   Streptococcus agalactiae NOT DETECTED NOT DETECTED Final   Streptococcus pneumoniae NOT DETECTED NOT DETECTED Final   Streptococcus pyogenes NOT DETECTED NOT DETECTED Final   Acinetobacter baumannii NOT DETECTED NOT DETECTED Final   Enterobacteriaceae species NOT DETECTED NOT DETECTED Final   Enterobacter cloacae complex NOT DETECTED NOT DETECTED Final   Escherichia coli NOT DETECTED NOT DETECTED Final   Klebsiella oxytoca NOT DETECTED NOT DETECTED Final   Klebsiella pneumoniae NOT DETECTED NOT DETECTED Final   Proteus species NOT DETECTED NOT DETECTED Final   Serratia marcescens NOT DETECTED NOT DETECTED Final   Carbapenem resistance NOT DETECTED NOT DETECTED Final   Haemophilus influenzae NOT DETECTED NOT DETECTED Final   Neisseria meningitidis NOT DETECTED NOT DETECTED Final   Pseudomonas aeruginosa NOT DETECTED NOT DETECTED Final   Candida albicans NOT DETECTED NOT DETECTED Final   Candida glabrata NOT DETECTED NOT DETECTED Final   Candida krusei NOT DETECTED NOT  DETECTED Final   Candida parapsilosis NOT DETECTED NOT DETECTED Final   Candida tropicalis NOT DETECTED NOT DETECTED Final    Comment: Performed at [facility]     Labs: Basic Metabolic Panel:  Recent Labs Lab 11/23/15 2240 11/24/15 0539  NA 144 144  K 3.6 3.9  CL 109 111  CO2 20* 23  GLUCOSE 181* 156*  BUN 16 15  CREATININE 1.35* 1.18  CALCIUM 9.5 8.6*   Liver Function Tests:  Recent Labs Lab 11/23/15 2240  AST 26  ALT 16*  ALKPHOS 52  BILITOT 0.9  PROT 6.9  ALBUMIN 4.6    Recent Labs Lab 11/23/15 2240  LIPASE 91*   No results for input(s): AMMONIA in the last 168 hours. CBC:  Recent  Labs Lab 11/23/15 2240 11/24/15 0539 11/25/15 0330  WBC 25.0* 19.3* 15.0*  HGB 17.7* 15.5 15.4  HCT 48.6 44.6 45.1  MCV 82.8 84.6 85.6  PLT 242 221 239   Cardiac Enzymes:  Recent Labs Lab 11/24/15 0539  CKTOTAL 150   BNP: BNP (last 3 results) No results for input(s): BNP in the last 8760 hours.  ProBNP (last 3 results) No results for input(s): PROBNP in the last 8760 hours.  CBG: No results for input(s): GLUCAP in the last 168 hours.   Signed:  CHIU, STEPHEN K  [facility] 11/26/2015, 3:27 PM

## 2015-11-27 ENCOUNTER — Inpatient Hospital Stay (HOSPITAL_COMMUNITY)
Admission: EM | Admit: 2015-11-27 | Discharge: 2015-11-29 | DRG: 103 | Disposition: A | Discharge status: Home / Self Care | Payer: MEDICAID | Attending: Internal Medicine | Admitting: Internal Medicine

## 2015-11-27 ENCOUNTER — Encounter (HOSPITAL_COMMUNITY): Payer: Self-pay | Admitting: *Deleted

## 2015-11-27 DIAGNOSIS — F1910 Other psychoactive substance abuse, uncomplicated: Secondary | ICD-10-CM | POA: Diagnosis present

## 2015-11-27 DIAGNOSIS — F1290 Cannabis use, unspecified, uncomplicated: Secondary | ICD-10-CM

## 2015-11-27 DIAGNOSIS — D72829 Elevated white blood cell count, unspecified: Secondary | ICD-10-CM | POA: Diagnosis present

## 2015-11-27 DIAGNOSIS — F12988 Cannabis use, unspecified with other cannabis-induced disorder: Secondary | ICD-10-CM

## 2015-11-27 DIAGNOSIS — R112 Nausea with vomiting, unspecified: Secondary | ICD-10-CM | POA: Diagnosis present

## 2015-11-27 DIAGNOSIS — Z9049 Acquired absence of other specified parts of digestive tract: Secondary | ICD-10-CM

## 2015-11-27 DIAGNOSIS — G43A0 Cyclical vomiting, not intractable: Principal | ICD-10-CM | POA: Diagnosis present

## 2015-11-27 DIAGNOSIS — F1410 Cocaine abuse, uncomplicated: Secondary | ICD-10-CM | POA: Diagnosis present

## 2015-11-27 DIAGNOSIS — F17200 Nicotine dependence, unspecified, uncomplicated: Secondary | ICD-10-CM | POA: Diagnosis present

## 2015-11-27 DIAGNOSIS — Z79899 Other long term (current) drug therapy: Secondary | ICD-10-CM

## 2015-11-27 LAB — URINALYSIS, ROUTINE W REFLEX MICROSCOPIC
GLUCOSE, UA: NEGATIVE mg/dL
Hgb urine dipstick: NEGATIVE
Ketones, ur: >80 mg/dL — AB
LEUKOCYTES UA: NEGATIVE
Nitrite: NEGATIVE
PH: 6.5 (ref 5.0–8.0)
PROTEIN: 30 mg/dL — AB
SPECIFIC GRAVITY, URINE: 1.031 — AB (ref 1.005–1.030)

## 2015-11-27 LAB — COMPREHENSIVE METABOLIC PANEL
ALT: 19 U/L (ref 17–63)
AST: 30 U/L (ref 15–41)
Albumin: 4.5 g/dL (ref 3.5–5.0)
Alkaline Phosphatase: 51 U/L (ref 38–126)
Anion gap: 14 (ref 5–15)
BUN: 14 mg/dL (ref 6–20)
CHLORIDE: 103 mmol/L (ref 101–111)
CO2: 21 mmol/L — ABNORMAL LOW (ref 22–32)
Calcium: 9.2 mg/dL (ref 8.9–10.3)
Creatinine, Ser: 1.37 mg/dL — ABNORMAL HIGH (ref 0.61–1.24)
Glucose, Bld: 110 mg/dL — ABNORMAL HIGH (ref 65–99)
POTASSIUM: 2.6 mmol/L — AB (ref 3.5–5.1)
SODIUM: 138 mmol/L (ref 135–145)
Total Bilirubin: 0.7 mg/dL (ref 0.3–1.2)
Total Protein: 7.2 g/dL (ref 6.5–8.1)

## 2015-11-27 LAB — URINE MICROSCOPIC-ADD ON
RBC / HPF: NONE SEEN RBC/hpf (ref 0–5)
WBC, UA: NONE SEEN WBC/hpf (ref 0–5)

## 2015-11-27 LAB — LIPASE, BLOOD: LIPASE: 30 U/L (ref 11–51)

## 2015-11-27 LAB — CBC
HEMATOCRIT: 44.3 % (ref 39.0–52.0)
HEMOGLOBIN: 15.8 g/dL (ref 13.0–17.0)
MCH: 29.1 pg (ref 26.0–34.0)
MCHC: 35.7 g/dL (ref 30.0–36.0)
MCV: 81.6 fL (ref 78.0–100.0)
PLATELETS: 284 10*3/uL (ref 150–400)
RBC: 5.43 MIL/uL (ref 4.22–5.81)
RDW: 12.3 % (ref 11.5–15.5)
WBC: 10 10*3/uL (ref 4.0–10.5)

## 2015-11-27 LAB — GC/CHLAMYDIA PROBE AMP (~~LOC~~) NOT AT ARMC
Chlamydia: NEGATIVE
Neisseria Gonorrhea: NEGATIVE

## 2015-11-27 LAB — RAPID URINE DRUG SCREEN, HOSP PERFORMED
Amphetamines: NOT DETECTED
Barbiturates: POSITIVE — AB
Benzodiazepines: POSITIVE — AB
Cocaine: POSITIVE — AB
OPIATES: POSITIVE — AB
TETRAHYDROCANNABINOL: POSITIVE — AB

## 2015-11-27 MED ORDER — HALOPERIDOL LACTATE 5 MG/ML IJ SOLN
3.0000 mg | Freq: Once | INTRAMUSCULAR | Status: AC
  Administered 2015-11-27: 3 mg via INTRAVENOUS
  Filled 2015-11-27: qty 1

## 2015-11-27 MED ORDER — POTASSIUM CHLORIDE 10 MEQ/100ML IV SOLN
10.0000 meq | Freq: Once | INTRAVENOUS | Status: AC
  Administered 2015-11-27: 10 meq via INTRAVENOUS
  Filled 2015-11-27: qty 100

## 2015-11-27 MED ORDER — ONDANSETRON HCL 4 MG/2ML IJ SOLN
4.0000 mg | Freq: Once | INTRAMUSCULAR | Status: AC | PRN
  Administered 2015-11-27: 4 mg via INTRAVENOUS

## 2015-11-27 MED ORDER — FENTANYL CITRATE (PF) 100 MCG/2ML IJ SOLN
100.0000 ug | Freq: Once | INTRAMUSCULAR | Status: AC
  Administered 2015-11-27: 100 ug via INTRAVENOUS
  Filled 2015-11-27: qty 2

## 2015-11-27 MED ORDER — ONDANSETRON HCL 4 MG/2ML IJ SOLN
INTRAMUSCULAR | Status: AC
  Filled 2015-11-27: qty 2

## 2015-11-27 MED ORDER — HYDROMORPHONE HCL 1 MG/ML IJ SOLN
1.0000 mg | Freq: Once | INTRAMUSCULAR | Status: AC
  Administered 2015-11-27: 1 mg via INTRAVENOUS
  Filled 2015-11-27: qty 1

## 2015-11-27 MED ORDER — PROMETHAZINE HCL 25 MG/ML IJ SOLN
25.0000 mg | Freq: Once | INTRAMUSCULAR | Status: AC
  Administered 2015-11-27: 25 mg via INTRAVENOUS
  Filled 2015-11-27: qty 1

## 2015-11-27 MED ORDER — SODIUM CHLORIDE 0.9 % IV BOLUS (SEPSIS)
1000.0000 mL | Freq: Once | INTRAVENOUS | Status: AC
  Administered 2015-11-27: 1000 mL via INTRAVENOUS

## 2015-11-27 NOTE — ED Notes (Signed)
Pt arrives via EMS. C/o abdominal pain, nausea and vomiting 5-6 days Hx of gastric ulcers. IV established, 4 Zofran administered en route. ST on the monitor, 160/100. [facility], admitted for 3 days and left ama.

## 2015-11-27 NOTE — ED Notes (Signed)
Pt says he was admitted and feeling somewhat better but they were not giving him any more medications so he left AMA. Has still been having persistent pain, n/v

## 2015-11-27 NOTE — ED Notes (Signed)
Critical alb received. Potassium 2.6

## 2015-11-27 NOTE — ED Notes (Signed)
After triage completion and providing medications for nausea, pt says that he is leaving and does not want to stay. IV removed, instructed pt that he can return if he wishes, ambulatory to lobby.

## 2015-11-28 DIAGNOSIS — F12988 Cannabis use, unspecified with other cannabis-induced disorder: Secondary | ICD-10-CM

## 2015-11-28 DIAGNOSIS — F1290 Cannabis use, unspecified, uncomplicated: Secondary | ICD-10-CM | POA: Diagnosis present

## 2015-11-28 MED ORDER — ONDANSETRON HCL 4 MG/2ML IJ SOLN
4.0000 mg | Freq: Four times a day (QID) | INTRAMUSCULAR | Status: DC | PRN
  Administered 2015-11-28 (×2): 4 mg via INTRAVENOUS
  Filled 2015-11-28 (×2): qty 2

## 2015-11-28 MED ORDER — FOLIC ACID 1 MG PO TABS
1.0000 mg | ORAL_TABLET | Freq: Every day | ORAL | Status: DC
  Administered 2015-11-29: 1 mg via ORAL
  Filled 2015-11-28 (×2): qty 1

## 2015-11-28 MED ORDER — LORAZEPAM 1 MG PO TABS: 1.0000 mg | ORAL_TABLET | Freq: Four times a day (QID) | ORAL | Status: DC | PRN

## 2015-11-28 MED ORDER — VITAMIN B-1 100 MG PO TABS
100.0000 mg | ORAL_TABLET | Freq: Every day | ORAL | Status: DC
  Administered 2015-11-29: 100 mg via ORAL
  Filled 2015-11-28 (×2): qty 1

## 2015-11-28 MED ORDER — THIAMINE HCL 100 MG/ML IJ SOLN
100.0000 mg | Freq: Every day | INTRAMUSCULAR | Status: DC
  Administered 2015-11-28: 100 mg via INTRAVENOUS
  Filled 2015-11-28 (×2): qty 2

## 2015-11-28 MED ORDER — SODIUM CHLORIDE 0.9 % IV SOLN
INTRAVENOUS | Status: DC
  Administered 2015-11-28 – 2015-11-29 (×3): via INTRAVENOUS

## 2015-11-28 MED ORDER — POTASSIUM CHLORIDE 10 MEQ/100ML IV SOLN
10.0000 meq | INTRAVENOUS | Status: DC
  Administered 2015-11-28 (×3): 10 meq via INTRAVENOUS
  Filled 2015-11-28 (×3): qty 100

## 2015-11-28 MED ORDER — HEPARIN SODIUM (PORCINE) 5000 UNIT/ML IJ SOLN: 5000.0000 [IU] | Freq: Three times a day (TID) | INTRAMUSCULAR | Status: DC

## 2015-11-28 MED ORDER — HALOPERIDOL LACTATE 5 MG/ML IJ SOLN
2.0000 mg | Freq: Four times a day (QID) | INTRAMUSCULAR | Status: DC | PRN
  Administered 2015-11-28: 2 mg via INTRAVENOUS
  Filled 2015-11-28: qty 1

## 2015-11-28 MED ORDER — POTASSIUM CHLORIDE CRYS ER 20 MEQ PO TBCR
40.0000 meq | EXTENDED_RELEASE_TABLET | Freq: Once | ORAL | Status: DC
  Filled 2015-11-28: qty 2

## 2015-11-28 MED ORDER — PROMETHAZINE HCL 25 MG PO TABS: 25.0000 mg | ORAL_TABLET | Freq: Four times a day (QID) | ORAL | Status: DC | PRN

## 2015-11-28 MED ORDER — HYDROCODONE-ACETAMINOPHEN 5-325 MG PO TABS
1.0000 | ORAL_TABLET | Freq: Four times a day (QID) | ORAL | Status: DC | PRN
  Administered 2015-11-28: 1 via ORAL
  Filled 2015-11-28: qty 1

## 2015-11-28 MED ORDER — LORAZEPAM 2 MG/ML IJ SOLN
1.0000 mg | Freq: Four times a day (QID) | INTRAMUSCULAR | Status: DC | PRN
  Administered 2015-11-28: 1 mg via INTRAVENOUS
  Filled 2015-11-28: qty 1

## 2015-11-28 MED ORDER — ADULT MULTIVITAMIN W/MINERALS CH
1.0000 | ORAL_TABLET | Freq: Every day | ORAL | Status: DC
  Administered 2015-11-29: 1 via ORAL
  Filled 2015-11-28 (×2): qty 1

## 2015-11-28 NOTE — ED Provider Notes (Signed)
CSN: [id number]     Arrival date & time 11/27/15  2044 History   First MD Initiated Contact with Patient 11/27/15 2210     Chief Complaint  Patient presents with  . Abdominal Pain     HPI Pt arrives via EMS. C/o abdominal pain, nausea and vomiting 5-6 days Hx of gastric ulcers. IV established, 4 Zofran administered en route. ST on the monitor, 160/100. Pt went to [facility] last week, admitted for 3 days and left ama.  Past Medical History  Diagnosis Date  . Peptic ulcer   . Gastritis    Past Surgical History  Procedure Laterality Date  . Cholecystectomy     No family history on file. Social History  Substance Use Topics  . Smoking status: Current Every Day Smoker -- 1.00 packs/day  . Smokeless tobacco: None  . Alcohol Use: Yes    Review of Systems  Unable to perform ROS: Acuity of condition      Allergies  Haldol; Thorazine; Toradol; Tramadol; Trazodone and nefazodone; and Penicillins  Home Medications   Prior to Admission medications   Medication Sig Start Date End Date Taking? Authorizing Provider  pantoprazole (PROTONIX) 20 MG tablet Take 1 tablet (20 mg total) by mouth daily. Patient not taking: Reported on 04/18/2015 04/16/15   Geoffery Lyons, MD  promethazine (PHENERGAN) 25 MG tablet Take 1 tablet (25 mg total) by mouth every 6 (six) hours as needed for nausea or vomiting. 11/28/15   Nelva Nay, MD   BP 130/50 mmHg  Pulse 91  Temp(Src) 97.6 F (36.4 C) (Oral)  Resp 18  Ht 6' (1.829 m)  Wt 205 lb (92.987 kg)  BMI 27.80 kg/m2  SpO2 100% Physical Exam  Constitutional: He is oriented to person, place, and time. He appears well-developed and well-nourished. He appears distressed (Uncontrollable vomiting).  HENT:  Head: Normocephalic and atraumatic.  Eyes: Pupils are equal, round, and reactive to light.  Neck: Normal range of motion.  Cardiovascular: Normal rate and intact distal pulses.   Pulmonary/Chest: No respiratory distress.  Abdominal: Soft. Normal  appearance. He exhibits no distension. There is tenderness. There is guarding. There is no rebound.  Musculoskeletal: Normal range of motion.  Neurological: He is alert and oriented to person, place, and time. No cranial nerve deficit.  Skin: Skin is warm and dry. No rash noted.  Psychiatric: He has a normal mood and affect. His behavior is normal.  Nursing note and vitals reviewed.   ED Course  Procedures (including critical care time) Medications  ondansetron (ZOFRAN) injection 4 mg ( Intravenous Return to [facility] 11/28/15 0117)  sodium chloride 0.9 % bolus 1,000 mL (0 mLs Intravenous Stopped 11/28/15 0521)  potassium chloride 10 mEq in 100 mL IVPB (0 mEq Intravenous Stopped 11/27/15 2345)  fentaNYL (SUBLIMAZE) injection 100 mcg (100 mcg Intravenous Given 11/27/15 2223)  haloperidol lactate (HALDOL) injection 3 mg (3 mg Intravenous Given 11/27/15 2259)  promethazine (PHENERGAN) injection 25 mg (25 mg Intravenous Given 11/27/15 2355)  HYDROmorphone (DILAUDID) injection 1 mg (1 mg Intravenous Given 11/27/15 2357)    Labs Review Labs Reviewed  COMPREHENSIVE METABOLIC PANEL - Abnormal; Notable for the following:    Potassium 2.6 (*)    CO2 21 (*)    Glucose, Bld 110 (*)    Creatinine, Ser 1.37 (*)    All other components within normal limits  URINALYSIS, ROUTINE W REFLEX MICROSCOPIC (NOT AT [facility]) - Abnormal; Notable for the following:    APPearance CLOUDY (*)  Specific Gravity, Urine 1.031 (*)    Bilirubin Urine SMALL (*)    Ketones, ur >80 (*)    Protein, ur 30 (*)    All other components within normal limits  URINE MICROSCOPIC-ADD ON - Abnormal; Notable for the following:    Squamous Epithelial / LPF 0-5 (*)    Bacteria, UA FEW (*)    Crystals TRIPLE PHOSPHATE CRYSTALS (*)    All other components within normal limits  URINE RAPID DRUG SCREEN, HOSP PERFORMED - Abnormal; Notable for the following:    Opiates POSITIVE (*)    Cocaine POSITIVE (*)    Benzodiazepines POSITIVE (*)     Tetrahydrocannabinol POSITIVE (*)    Barbiturates POSITIVE (*)    All other components within normal limits  CBC - Abnormal; Notable for the following:    HCT 38.3 (*)    All other components within normal limits  BASIC METABOLIC PANEL - Abnormal; Notable for the following:    Calcium 8.3 (*)    All other components within normal limits  LIPASE, BLOOD  CBC  MAGNESIUM    Imaging Review   MDM   Final diagnoses:  Cannabinoid hyperemesis syndrome (HCC)        Nelva Nayobert Angelica Frandsen, MD 11/30/15 [phone]

## 2015-11-28 NOTE — ED Notes (Signed)
attempted to call report, Charge RN will have assigned RN call back

## 2015-11-28 NOTE — Progress Notes (Addendum)
Pt seen and examined at bedside. Please see earlier admission note by Dr. Julian ReilGardner. Pt admitted for evaluation of nausea and non bloody vomiting thought to be secondary to polysubstance abuse including THC. Pt currently in withdrawal and uncomfortable, will place on CIWA protocol, added low dose vicodin. Monitor. Advance diet. K still low, supplement, check mg level with next blood work. Also continue IVF as Cr still up. Repeat BMP and CBC in AM.  Debbora PrestoMAGICK-Almeta Geisel, MD  [facility] Pager [phone]  If 7PM-7AM, please contact night-coverage [email or web address] Password [email or web address]

## 2015-11-28 NOTE — ED Notes (Signed)
attempted to call report, no answer

## 2015-11-28 NOTE — ED Notes (Signed)
Patient refused to take PO potassium, states he is can not take oral medications at this time due to not being able to swallow them. No apparent difficulty managing secretions. Patient has been drinking fluids.

## 2015-11-28 NOTE — Discharge Instructions (Signed)
Cannabis Use Disorder Cannabis use disorder is a mental disorder. It is not one-time or occasional use of cannabis, more commonly known as marijuana. Cannabis use disorder is the continued, nonmedical use of cannabis that interferes with normal life activities or causes health problems. People with cannabis use disorder get a feeling of extreme pleasure and relaxation from cannabis use. This "high" is very rewarding and causes people to use over and over.  The mind-altering ingredient in cannabis is know as THC. THC can also interfere with motor coordination, memory, judgment, and accurate sense of space and time. These effects can last for a few days after using cannabis. Regular heavy cannabis use can cause long-lasting problems with thinking and learning. In young people, these problems may be permanent. Cannabis sometimes causes severe anxiety, paranoia, or visual hallucinations. Man-made (synthetic) cannabis-like drugs, such as "spice" and "K2," cause the same effects as THC but are much stronger. Cannabis-like drugs can cause dangerously high blood pressure and heart rate.  Cannabis use disorder usually starts in the teenage years. It can trigger the development of schizophrenia. It is somewhat more common in men than women. People who have family members with the disorder or existing mental health issues such as depression and posttraumatic stress disorderare more likely to develop cannabis use disorder. People with cannabis use disorder are at higher risk for use of other drugs of abuse.  SIGNS AND SYMPTOMS Signs and symptoms of cannabis use disorder include:   Use of cannabis in larger amounts or over a longer period than intended.   Unsuccessful attempts to cut down or control cannabis use.   A lot of time spent obtaining, using, or recovering from the effects of cannabis.   A strong desire or urge to use cannabis (cravings).   Continued use of cannabis in spite of problems at work,  school, or home because of use.   Continued use of cannabis in spite of relationship problems because of use.  Giving up or cutting down on important life activities because of cannabis use.  Use of cannabis over and over even in situations when it is physically hazardous, such as when driving a car.   Continued use of cannabis in spite of a physical problem that is likely related to use. Physical problems can include:  Chronic cough.  Bronchitis.  Emphysema.  Throat and lung cancer.  Continued use of cannabis in spite of a mental problem that is likely related to use. Mental problems can include:  Psychosis.  Anxiety.  Difficulty sleeping.  Need to use more and more cannabis to get the same effect, or lessened effect over time with use of the same amount (tolerance).  Having withdrawal symptoms when cannabis use is stopped, or using cannabis to reduce or avoid withdrawal symptoms. Withdrawal symptoms include:  Irritability or anger.  Anxiety or restlessness.  Difficulty sleeping.  Loss of appetite or weight.  Aches and pains.  Shakiness.  Sweating.  Chills. DIAGNOSIS Cannabis use disorder is diagnosed by your health care provider. You may be asked questions about your cannabis use and how it affects your life. A physical exam may be done. A drug screen may be done. You may be referred to a mental health professional. The diagnosis of cannabis use disorder requires at least two symptoms within 12 months. The type of cannabis use disorder you have depends on the number of symptoms you have. The type may be:  Mild. Two or three signs and symptoms.   Moderate. Four or   five signs and symptoms.   Severe. Six or more signs and symptoms.  TREATMENT Treatment is usually provided by mental health professionals with training in substance use disorders. The following options are available:  Counseling or talk therapy. Talk therapy addresses the reasons you use  cannabis. It also addresses ways to keep you from using again. The goals of talk therapy include:  Identifying and avoiding triggers for use.  Learning how to handle cravings.  Replacing use with healthy activities.  Support groups. Support groups provide emotional support, advice, and guidance.  Medicine. Medicine is used to treat mental health issues that trigger cannabis use or that result from it. HOME CARE INSTRUCTIONS  Take medicines only as directed by your health care provider.  Check with your health care provider before starting any new medicines.  Keep all follow-up visits as directed by your health care provider. SEEK MEDICAL CARE IF:  You are not able to take your medicines as directed.  Your symptoms get worse. SEEK IMMEDIATE MEDICAL CARE IF: You have serious thoughts about hurting yourself or others. FOR MORE INFORMATION  National Institute on Drug Abuse: www.drugabuse.gov  Substance Abuse and Mental Health Services Administration: www.samhsa.gov   This information is not intended to replace advice given to you by your health care provider. Make sure you discuss any questions you have with your health care provider.   Document Released: 06/28/2000 Document Revised: 07/22/2014 Document Reviewed: 07/14/2013 Elsevier Interactive Patient Education 2016 [employer]  

## 2015-11-28 NOTE — ED Notes (Signed)
Attempted to call report

## 2015-11-28 NOTE — H&P (Signed)
History and Physical    Miguel Roman ZOX:[id number] DOB: 1990-12-27 DOA: 11/27/2015  Referring MD/NP/PA: Dr. Radford Pax PCP: No primary care provider on file. Outpatient Specialists: None Patient coming from: ED  Chief Complaint: Nausea / Vomiting  HPI: Miguel Roman is a 25 y.o. male with medical history significant of cannabis induced cyclic vomiting syndrome, polysubstance abuse.  Patient just left our service AMA on the 14th.  He went home and now returns to the ED with ongoing N/V.  During that stay it was recommended that opiates be avoided as well as Cannabis.  ED Course: Patient now has barbituates and opiates in his UDS in addition to the prior THC, cocaine, and benzos (UDS positive for everything but amphetamine).  His symptoms were finally controlled with haldol in the ED.  Review of Systems: As per HPI otherwise 10 point review of systems negative.    Past Medical History  Diagnosis Date  . Peptic ulcer   . Gastritis     Past Surgical History  Procedure Laterality Date  . Cholecystectomy       reports that he has been smoking.  He does not have any smokeless tobacco history on file. He reports that he drinks alcohol. He reports that he uses illicit drugs (Marijuana).  Allergies  Allergen Reactions  . Haldol [Haloperidol] Swelling    Oral swelling  . Thorazine [Chlorpromazine]   . Toradol [Ketorolac Tromethamine]   . Tramadol   . Trazodone And Nefazodone   . Penicillins Hives and Rash    Has patient had a PCN reaction causing immediate rash, facial/tongue/throat swelling, SOB or lightheadedness with hypotension: No Has patient had a PCN reaction causing severe rash involving mucus membranes or skin necrosis: No Has patient had a PCN reaction that required hospitalization No Has patient had a PCN reaction occurring within the last 10 years: No If all of the above answers are "NO", then may proceed with Cephalosporin use.     No family history on  file.   Prior to Admission medications   Medication Sig Start Date End Date Taking? Authorizing Provider  pantoprazole (PROTONIX) 20 MG tablet Take 1 tablet (20 mg total) by mouth daily. Patient not taking: Reported on 04/18/2015 04/16/15   Geoffery Lyons, MD  promethazine (PHENERGAN) 25 MG tablet Take 1 tablet (25 mg total) by mouth every 6 (six) hours as needed for nausea or vomiting. 11/28/15   Nelva Nay, MD    Physical Exam: Filed Vitals:   11/27/15 2046 11/27/15 2208 11/27/15 2300 11/27/15 2330  BP: 144/112  144/92 162/85  Pulse: 121  98 77  Temp: 98.2 F (36.8 C)     TempSrc: Oral     Resp: 24     Height: 6' (1.829 m)     Weight: 92.987 kg (205 lb)     SpO2: 99% 100% 100% 93%      Constitutional: NAD, calm, comfortable Filed Vitals:   11/27/15 2046 11/27/15 2208 11/27/15 2300 11/27/15 2330  BP: 144/112  144/92 162/85  Pulse: 121  98 77  Temp: 98.2 F (36.8 C)     TempSrc: Oral     Resp: 24     Height: 6' (1.829 m)     Weight: 92.987 kg (205 lb)     SpO2: 99% 100% 100% 93%   Eyes: PERRL, lids and conjunctivae normal ENMT: Mucous membranes are moist. Posterior pharynx clear of any exudate or lesions.Normal dentition.  Neck: normal, supple, no masses, no thyromegaly Respiratory: clear to auscultation  bilaterally, no wheezing, no crackles. Normal respiratory effort. No accessory muscle use.  Cardiovascular: Regular rate and rhythm, no murmurs / rubs / gallops. No extremity edema. 2+ pedal pulses. No carotid bruits.  Abdomen: no tenderness, no masses palpated. No hepatosplenomegaly. Bowel sounds positive.  Musculoskeletal: no clubbing / cyanosis. No joint deformity upper and lower extremities. Good ROM, no contractures. Normal muscle tone.  Skin: no rashes, lesions, ulcers. No induration Neurologic: CN 2-12 grossly intact. Sensation intact, DTR normal. Strength 5/5 in all 4.  Psychiatric: Normal judgment and insight. Alert and oriented x 3. Normal mood.    Labs on  Admission: I have personally reviewed following labs and imaging studies  CBC:  Recent Labs Lab 11/23/15 2240 11/24/15 0539 11/25/15 0330 11/27/15 2118  WBC 25.0* 19.3* 15.0* 10.0  HGB 17.7* 15.5 15.4 15.8  HCT 48.6 44.6 45.1 44.3  MCV 82.8 84.6 85.6 81.6  PLT 242 221 239 284   Basic Metabolic Panel:  Recent Labs Lab 11/23/15 2240 11/24/15 0539 11/27/15 2118  NA 144 144 138  K 3.6 3.9 2.6*  CL 109 111 103  CO2 20* 23 21*  GLUCOSE 181* 156* 110*  BUN 16 15 14   CREATININE 1.35* 1.18 1.37*  CALCIUM 9.5 8.6* 9.2   GFR: Estimated Creatinine Clearance: 89.7 mL/min (by C-G formula based on Cr of 1.37). Liver Function Tests:  Recent Labs Lab 11/23/15 2240 11/27/15 2118  AST 26 30  ALT 16* 19  ALKPHOS 52 51  BILITOT 0.9 0.7  PROT 6.9 7.2  ALBUMIN 4.6 4.5    Recent Labs Lab 11/23/15 2240 11/27/15 2118  LIPASE 91* 30   No results for input(s): AMMONIA in the last 168 hours. Coagulation Profile: No results for input(s): INR, PROTIME in the last 168 hours. Cardiac Enzymes:  Recent Labs Lab 11/24/15 0539  CKTOTAL 150   BNP (last 3 results) No results for input(s): PROBNP in the last 8760 hours. HbA1C: No results for input(s): HGBA1C in the last 72 hours. CBG: No results for input(s): GLUCAP in the last 168 hours. Lipid Profile: No results for input(s): CHOL, HDL, LDLCALC, TRIG, CHOLHDL, LDLDIRECT in the last 72 hours. Thyroid Function Tests: No results for input(s): TSH, T4TOTAL, FREET4, T3FREE, THYROIDAB in the last 72 hours. Anemia Panel: No results for input(s): VITAMINB12, FOLATE, FERRITIN, TIBC, IRON, RETICCTPCT in the last 72 hours. Urine analysis:    Component Value Date/Time   COLORURINE YELLOW 11/27/2015 2053   APPEARANCEUR CLOUDY* 11/27/2015 2053   LABSPEC 1.031* 11/27/2015 2053   PHURINE 6.5 11/27/2015 2053   GLUCOSEU NEGATIVE 11/27/2015 2053   HGBUR NEGATIVE 11/27/2015 2053   BILIRUBINUR SMALL* 11/27/2015 2053   KETONESUR >80*  11/27/2015 2053   PROTEINUR 30* 11/27/2015 2053   UROBILINOGEN 0.2 04/18/2015 0730   NITRITE NEGATIVE 11/27/2015 2053   LEUKOCYTESUR NEGATIVE 11/27/2015 2053   Sepsis Labs: @LABRCNTIP (procalcitonin:4,lacticidven:4) ) Recent Results (from the past 240 hour(s))  Culture, blood (routine x 2)     Status: None (Preliminary result)   Collection Time: 11/24/15  4:01 PM  Result Value Ref Range Status   Specimen Description RIGHT ANTECUBITAL  Final   Special Requests BOTTLES DRAWN AEROBIC AND ANAEROBIC 10CC  Final   Culture  Setup Time   Final    ANAEROBIC BOTTLE ONLY GRAM POSITIVE COCCI IN CLUSTERS CRITICAL RESULT CALLED TO, READ BACK BY AND VERIFIED WITH: TO JCLARKE(RN) BY TCLEVELAND 11/26/2015 AT 1:55AM Organism ID to follow    Culture   Final    CULTURE REINCUBATED  FOR BETTER GROWTH Performed at [facility]    Report Status PENDING  Incomplete  Culture, blood (routine x 2)     Status: None (Preliminary result)   Collection Time: 11/24/15  4:01 PM  Result Value Ref Range Status   Specimen Description BLOOD BLOOD LEFT WRIST  Final   Special Requests IN PEDIATRIC BOTTLE 1CC  Final   Culture   Final    NO GROWTH 2 DAYS Performed at [facility]    Report Status PENDING  Incomplete  Blood Culture ID Panel (Reflexed)     Status: Abnormal   Collection Time: 11/24/15  4:01 PM  Result Value Ref Range Status   Enterococcus species NOT DETECTED NOT DETECTED Final   Vancomycin resistance NOT DETECTED NOT DETECTED Final   Listeria monocytogenes NOT DETECTED NOT DETECTED Final   Staphylococcus species DETECTED (A) NOT DETECTED Final    Comment: CRITICAL RESULT CALLED TO, READ BACK BY AND VERIFIED WITH: TO JGRIMSLEY(PHARD) BY TCLEVELAND 11/26/2015 AT 4:21AM    Staphylococcus aureus NOT DETECTED NOT DETECTED Final   Methicillin resistance NOT DETECTED NOT DETECTED Final   Streptococcus species NOT DETECTED NOT DETECTED Final   Streptococcus agalactiae NOT DETECTED NOT DETECTED  Final   Streptococcus pneumoniae NOT DETECTED NOT DETECTED Final   Streptococcus pyogenes NOT DETECTED NOT DETECTED Final   Acinetobacter baumannii NOT DETECTED NOT DETECTED Final   Enterobacteriaceae species NOT DETECTED NOT DETECTED Final   Enterobacter cloacae complex NOT DETECTED NOT DETECTED Final   Escherichia coli NOT DETECTED NOT DETECTED Final   Klebsiella oxytoca NOT DETECTED NOT DETECTED Final   Klebsiella pneumoniae NOT DETECTED NOT DETECTED Final   Proteus species NOT DETECTED NOT DETECTED Final   Serratia marcescens NOT DETECTED NOT DETECTED Final   Carbapenem resistance NOT DETECTED NOT DETECTED Final   Haemophilus influenzae NOT DETECTED NOT DETECTED Final   Neisseria meningitidis NOT DETECTED NOT DETECTED Final   Pseudomonas aeruginosa NOT DETECTED NOT DETECTED Final   Candida albicans NOT DETECTED NOT DETECTED Final   Candida glabrata NOT DETECTED NOT DETECTED Final   Candida krusei NOT DETECTED NOT DETECTED Final   Candida parapsilosis NOT DETECTED NOT DETECTED Final   Candida tropicalis NOT DETECTED NOT DETECTED Final    Comment: Performed [facility]      Radiological Exams on Admission: No results found.  EKG: Independently reviewed.  Assessment/Plan Principal Problem:   Cannabinoid hyperemesis syndrome (HCC) Active Problems:   Polysubstance abuse   Leukocytosis    Cannabinoid hyperemesis syndrome -  No narcotics  Needs to quit smoking Cannabis  Zofran, and haldol for symptom control.  He tolerated haldol in ED without adverse reaction.  Discussed all of the above with patient  Replacing potassium  IVF  Leukocytosis -  Echo was negative  BCx were 1 out of 2 positive but for a coag negative staph.  This is likely just a contaminate.  Polysubstance abuse -  Patient really needs help with this.  He probably shouldn't be having these pan positive UDS while on house arrest / probation?  Something tells me this is probably a  violation.  Will get social work consult.  Not clear what we are allowed under HIPPA to disclose to police.     DVT prophylaxis: Heparin [place] Code Status: Full Family Communication: None Consults called: None Admission status: Admit to obs   GARDNER, Heywood IlesJARED M. DO [facility] Pager [phone] from 7PM-7AM  If 7AM-7PM, please contact the day physician for the patient  [email or web address] Password [facility]  11/28/2015, 12:48 AM

## 2015-11-29 DIAGNOSIS — F1910 Other psychoactive substance abuse, uncomplicated: Secondary | ICD-10-CM

## 2015-11-29 DIAGNOSIS — D72829 Elevated white blood cell count, unspecified: Secondary | ICD-10-CM

## 2015-11-29 LAB — BASIC METABOLIC PANEL
ANION GAP: 10 (ref 5–15)
BUN: 6 mg/dL (ref 6–20)
CALCIUM: 8.3 mg/dL — AB (ref 8.9–10.3)
CO2: 26 mmol/L (ref 22–32)
CREATININE: 0.89 mg/dL (ref 0.61–1.24)
Chloride: 106 mmol/L (ref 101–111)
GFR calc Af Amer: >60 mL/min (ref >60)
GFR calc non Af Amer: >60 mL/min (ref >60)
GLUCOSE: 94 mg/dL (ref 65–99)
Potassium: 3.5 mmol/L (ref 3.5–5.1)
Sodium: 142 mmol/L (ref 135–145)

## 2015-11-29 LAB — CULTURE, BLOOD (ROUTINE X 2): Culture: NO GROWTH

## 2015-11-29 LAB — CBC
HEMATOCRIT: 38.3 % — AB (ref 39.0–52.0)
HEMOGLOBIN: 13.5 g/dL (ref 13.0–17.0)
MCH: 29.7 pg (ref 26.0–34.0)
MCHC: 35.2 g/dL (ref 30.0–36.0)
MCV: 84.2 fL (ref 78.0–100.0)
PLATELETS: 221 10*3/uL (ref 150–400)
RBC: 4.55 MIL/uL (ref 4.22–5.81)
RDW: 12.7 % (ref 11.5–15.5)
WBC: 6.1 10*3/uL (ref 4.0–10.5)

## 2015-11-29 LAB — MAGNESIUM: Magnesium: 2 mg/dL (ref 1.7–2.4)

## 2015-11-29 NOTE — Progress Notes (Signed)
Upon entering patient room noticed IV tubing in trash with pump still going.Patient in bathroom with shower running.Patient asking for diet order to be changed.Will report off to oncoming shift.

## 2015-11-29 NOTE — Discharge Summary (Signed)
Physician Discharge Summary  Miguel Roman OZH:[id number] DOB: [birth year] DOA: 11/27/2015  PCP: No primary care provider on file.  Admit date: 11/27/2015 Discharge date: 11/29/2015  Recommendations for Outpatient Follow-up:  1. Pt will need to follow up with PCP in 2-3 weeks post discharge   Discharge Diagnoses:  Principal Problem:   Cannabinoid hyperemesis syndrome (HCC) Active Problems:   Polysubstance abuse   Leukocytosis  Discharge Condition: Stable  Diet recommendation: Heart healthy diet discussed in details   History of present illness:  25 y.o. male with medical history significant of cannabis induced cyclic vomiting syndrome, polysubstance abuse. Patient just left our service AMA on the 14th. He went home and now returned to the ED with ongoing N/V.  Hospital Course:  Principal Problem:   Cannabinoid hyperemesis syndrome (HCC)   Polysubstance abuse with withdrawal on admission   - resolved, tolerating diet well, reports feeling better - wants to go home - no signs of withdrawal - VSS and blood work stable - pt counseled on substance abuse and importance of cessation   Procedures/Studies: Ct Abdomen Pelvis W Contrast 11/24/2015  No acute intra-abdominal or pelvic pathology.   Dg Chest Port 1 View 11/24/2015  No acute disease.   Discharge Exam: Filed Vitals:   11/28/15 2030 11/29/15 0445  BP: 133/73 130/50  Pulse: 60 91  Temp: 98.3 F (36.8 C) 97.6 F (36.4 C)  Resp: 18 18   Filed Vitals:   11/28/15 1200 11/28/15 1344 11/28/15 2030 11/29/15 0445  BP:  137/79 133/73 130/50  Pulse:  56 60 91  Temp:  98.2 F (36.8 C) 98.3 F (36.8 C) 97.6 F (36.4 C)  TempSrc: Oral  Oral   Resp:  18 18 18   Height:      Weight:      SpO2: 100% 100% 100% 100%    General: Pt is alert, follows commands appropriately, not in acute distress Cardiovascular: Regular rate and rhythm, S1/S2 +, no murmurs, no rubs, no gallops Respiratory: Clear to auscultation  bilaterally, no wheezing, no crackles, no rhonchi Abdominal: Soft, non tender, non distended, bowel sounds +, no guarding Extremities: no edema, no cyanosis, pulses palpable bilaterally DP and PT Neuro: Grossly nonfocal  Discharge Instructions  Discharge Instructions    Diet - low sodium heart healthy    Complete by:  As directed      Increase activity slowly    Complete by:  As directed             Medication List    TAKE these medications        pantoprazole 20 MG tablet  Commonly known as:  PROTONIX  Take 1 tablet (20 mg total) by mouth daily.     promethazine 25 MG tablet  Commonly known as:  PHENERGAN  Take 1 tablet (25 mg total) by mouth every 6 (six) hours as needed for nausea or vomiting.           Follow-up Information    Follow up with [facility] EMERGENCY DEPARTMENT.   Specialty:  Emergency Medicine   Why:  If symptoms worsen   Contact information:   [address] [id number] mc [place] [address] [phone]       The results of significant diagnostics from this hospitalization (including imaging, microbiology, ancillary and laboratory) are listed below for reference.     Microbiology: Recent Results (from the past 240 hour(s))  Culture, blood (routine x 2)     Status: None (Preliminary  result)   Collection Time: 11/24/15  4:01 PM  Result Value Ref Range Status   Specimen Description RIGHT ANTECUBITAL  Final   Special Requests BOTTLES DRAWN AEROBIC AND ANAEROBIC 10CC  Final   Culture  Setup Time   Final    ANAEROBIC BOTTLE ONLY GRAM POSITIVE COCCI IN CLUSTERS CRITICAL RESULT CALLED TO, READ BACK BY AND VERIFIED WITH: TO JCLARKE(RN) BY TCLEVELAND 11/26/2015 AT 1:55AM Organism ID to follow Performed at [facility]    Culture [facility]  Final   Report Status PENDING  Incomplete  Culture, blood (routine x 2)     Status: None (Preliminary result)   Collection Time: 11/24/15  4:01 PM   Result Value Ref Range Status   Specimen Description BLOOD BLOOD LEFT WRIST  Final   Special Requests IN PEDIATRIC BOTTLE 1CC  Final   Culture   Final    NO GROWTH 4 DAYS Performed at [facility]    Report Status PENDING  Incomplete  Blood Culture ID Panel (Reflexed)     Status: Abnormal   Collection Time: 11/24/15  4:01 PM  Result Value Ref Range Status   Enterococcus species NOT DETECTED NOT DETECTED Final   Vancomycin resistance NOT DETECTED NOT DETECTED Final   Listeria monocytogenes NOT DETECTED NOT DETECTED Final   Staphylococcus species DETECTED (A) NOT DETECTED Final    Comment: CRITICAL RESULT CALLED TO, READ BACK BY AND VERIFIED WITH: TO JGRIMSLEY(PHARD) BY TCLEVELAND 11/26/2015 AT 4:21AM    Staphylococcus aureus NOT DETECTED NOT DETECTED Final   Methicillin resistance NOT DETECTED NOT DETECTED Final   Streptococcus species NOT DETECTED NOT DETECTED Final   Streptococcus agalactiae NOT DETECTED NOT DETECTED Final   Streptococcus pneumoniae NOT DETECTED NOT DETECTED Final   Streptococcus pyogenes NOT DETECTED NOT DETECTED Final   Acinetobacter baumannii NOT DETECTED NOT DETECTED Final   Enterobacteriaceae species NOT DETECTED NOT DETECTED Final   Enterobacter cloacae complex NOT DETECTED NOT DETECTED Final   Escherichia coli NOT DETECTED NOT DETECTED Final   Klebsiella oxytoca NOT DETECTED NOT DETECTED Final   Klebsiella pneumoniae NOT DETECTED NOT DETECTED Final   Proteus species NOT DETECTED NOT DETECTED Final   Serratia marcescens NOT DETECTED NOT DETECTED Final   Carbapenem resistance NOT DETECTED NOT DETECTED Final   Haemophilus influenzae NOT DETECTED NOT DETECTED Final   Neisseria meningitidis NOT DETECTED NOT DETECTED Final   Pseudomonas aeruginosa NOT DETECTED NOT DETECTED Final   Candida albicans NOT DETECTED NOT DETECTED Final   Candida glabrata NOT DETECTED NOT DETECTED Final   Candida krusei NOT DETECTED NOT DETECTED Final   Candida parapsilosis  NOT DETECTED NOT DETECTED Final   Candida tropicalis NOT DETECTED NOT DETECTED Final    Comment: Performed at [facility]     Labs: Basic Metabolic Panel:  Recent Labs Lab 11/23/15 2240 11/24/15 0539 11/27/15 2118 11/29/15 0645  NA 144 144 138 142  K 3.6 3.9 2.6* 3.5  CL 109 111 103 106  CO2 20* 23 21* 26  GLUCOSE 181* 156* 110* 94  BUN CREATININE 1.35* 1.18 1.37* 0.89  CALCIUM 9.5 8.6* 9.2 8.3*  MG  --   --   --  2.0   Liver Function Tests:  Recent Labs Lab 11/23/15 2240 11/27/15 2118  AST 26 30  ALT 16* 19  ALKPHOS 52 51  BILITOT 0.9 0.7  PROT 6.9 7.2  ALBUMIN 4.6 4.5    Recent Labs Lab 11/23/15 2240 11/27/15  2118  LIPASE 91* 30   CBC:  Recent Labs Lab 11/23/15 2240 11/24/15 0539 11/25/15 0330 11/27/15 2118 11/29/15 0645  WBC 25.0* 19.3* 15.0* 10.0 6.1  HGB 17.7* 15.5 15.4 15.8 13.5  HCT 48.6 44.6 45.1 44.3 38.3*  MCV 82.8 84.6 85.6 81.6 84.2  PLT 242 221 239 284 221   Cardiac Enzymes:  Recent Labs Lab 11/24/15 0539  CKTOTAL 150    SIGNED: Time coordinating discharge:  30 minutes  Debbora PrestoMAGICK-MYERS, ISKRA, MD  [facility] 11/29/2015, 8:41 AM Pager [phone]  If 7PM-7AM, please contact night-coverage [email or web address] Password [email or web address]

## 2015-11-30 ENCOUNTER — Emergency Department (HOSPITAL_COMMUNITY)
Admission: EM | Admit: 2015-11-30 | Discharge: 2015-12-01 | Disposition: A | Discharge status: Home / Self Care | Payer: MEDICAID | Attending: Emergency Medicine | Admitting: Emergency Medicine

## 2015-11-30 ENCOUNTER — Encounter (HOSPITAL_COMMUNITY): Payer: Self-pay | Admitting: Emergency Medicine

## 2015-11-30 DIAGNOSIS — K859 Acute pancreatitis without necrosis or infection, unspecified: Secondary | ICD-10-CM | POA: Insufficient documentation

## 2015-11-30 DIAGNOSIS — Z9049 Acquired absence of other specified parts of digestive tract: Secondary | ICD-10-CM | POA: Insufficient documentation

## 2015-11-30 DIAGNOSIS — R112 Nausea with vomiting, unspecified: Secondary | ICD-10-CM

## 2015-11-30 DIAGNOSIS — Z880 Allergy status to penicillin: Secondary | ICD-10-CM | POA: Insufficient documentation

## 2015-11-30 DIAGNOSIS — F17200 Nicotine dependence, unspecified, uncomplicated: Secondary | ICD-10-CM | POA: Insufficient documentation

## 2015-11-30 DIAGNOSIS — Z8711 Personal history of peptic ulcer disease: Secondary | ICD-10-CM | POA: Insufficient documentation

## 2015-11-30 MED ORDER — SODIUM CHLORIDE 0.9 % IV BOLUS (SEPSIS)
1000.0000 mL | Freq: Once | INTRAVENOUS | Status: AC
  Administered 2015-12-01: 1000 mL via INTRAVENOUS

## 2015-11-30 NOTE — ED Notes (Signed)
Pt arrives via EMS with c/o abdominal pain, nausea, vomiting x4 hours. IV started by EMS in L wrist, 8MG  zofran on board.

## 2015-12-01 LAB — COMPREHENSIVE METABOLIC PANEL
ALT: 18 U/L (ref 17–63)
ANION GAP: 14 (ref 5–15)
AST: 25 U/L (ref 15–41)
Albumin: 4.1 g/dL (ref 3.5–5.0)
Alkaline Phosphatase: 48 U/L (ref 38–126)
BUN: 6 mg/dL (ref 6–20)
CALCIUM: 9.1 mg/dL (ref 8.9–10.3)
CHLORIDE: 104 mmol/L (ref 101–111)
CO2: 24 mmol/L (ref 22–32)
CREATININE: 1.11 mg/dL (ref 0.61–1.24)
Glucose, Bld: 118 mg/dL — ABNORMAL HIGH (ref 65–99)
Potassium: 3.2 mmol/L — ABNORMAL LOW (ref 3.5–5.1)
SODIUM: 142 mmol/L (ref 135–145)
Total Bilirubin: 0.7 mg/dL (ref 0.3–1.2)
Total Protein: 6.9 g/dL (ref 6.5–8.1)

## 2015-12-01 LAB — CBC WITH DIFFERENTIAL/PLATELET
BASOS ABS: 0 10*3/uL (ref 0.0–0.1)
Basophils Relative: 0 %
EOS ABS: 0.1 10*3/uL (ref 0.0–0.7)
EOS PCT: 1 %
HCT: 43.5 % (ref 39.0–52.0)
HEMOGLOBIN: 15 g/dL (ref 13.0–17.0)
LYMPHS ABS: 1.4 10*3/uL (ref 0.7–4.0)
LYMPHS PCT: 12 %
MCH: 28.5 pg (ref 26.0–34.0)
MCHC: 34.5 g/dL (ref 30.0–36.0)
MCV: 82.7 fL (ref 78.0–100.0)
Monocytes Absolute: 0.7 10*3/uL (ref 0.1–1.0)
Monocytes Relative: 6 %
NEUTROS PCT: 81 %
Neutro Abs: 9.1 10*3/uL — ABNORMAL HIGH (ref 1.7–7.7)
PLATELETS: 303 10*3/uL (ref 150–400)
RBC: 5.26 MIL/uL (ref 4.22–5.81)
RDW: 12.5 % (ref 11.5–15.5)
WBC: 11.3 10*3/uL — AB (ref 4.0–10.5)

## 2015-12-01 LAB — LIPASE, BLOOD: LIPASE: 265 U/L — AB (ref 11–51)

## 2015-12-01 MED ORDER — METOCLOPRAMIDE HCL 5 MG/ML IJ SOLN
10.0000 mg | Freq: Once | INTRAMUSCULAR | Status: AC
  Administered 2015-12-01: 10 mg via INTRAVENOUS
  Filled 2015-12-01: qty 2

## 2015-12-01 MED ORDER — SODIUM CHLORIDE 0.9 % IV BOLUS (SEPSIS)
1000.0000 mL | Freq: Once | INTRAVENOUS | Status: AC
  Administered 2015-12-01: 1000 mL via INTRAVENOUS

## 2015-12-01 MED ORDER — HALOPERIDOL LACTATE 5 MG/ML IJ SOLN
2.0000 mg | Freq: Once | INTRAMUSCULAR | Status: AC
  Administered 2015-12-01: 2 mg via INTRAVENOUS

## 2015-12-01 MED ORDER — PANTOPRAZOLE SODIUM 40 MG IV SOLR
40.0000 mg | Freq: Once | INTRAVENOUS | Status: AC
  Administered 2015-12-01: 40 mg via INTRAVENOUS
  Filled 2015-12-01: qty 40

## 2015-12-01 MED ORDER — HALOPERIDOL LACTATE 5 MG/ML IJ SOLN
INTRAMUSCULAR | Status: AC
  Filled 2015-12-01: qty 1

## 2015-12-01 MED ORDER — PROMETHAZINE HCL 25 MG PO TABS
25.0000 mg | ORAL_TABLET | Freq: Four times a day (QID) | ORAL | Status: DC | PRN
Start: 2015-12-01 — End: 2016-01-22

## 2015-12-01 MED ORDER — PANTOPRAZOLE SODIUM 20 MG PO TBEC: 20.0000 mg | DELAYED_RELEASE_TABLET | Freq: Every day | ORAL | Status: AC

## 2015-12-01 MED ORDER — OXYCODONE-ACETAMINOPHEN 5-325 MG PO TABS: 1.0000 | ORAL_TABLET | Freq: Once | ORAL | Status: DC

## 2015-12-01 NOTE — ED Provider Notes (Signed)
CSN: [id number]     Arrival date & time 11/30/15  2340 History   First MD Initiated Contact with Patient 11/30/15 2345     Chief Complaint  Patient presents with  . Abdominal Pain  . Emesis     (Consider location/radiation/quality/duration/timing/severity/associated sxs/prior Treatment) HPI  Patient to the ER has as PMH of peptic ulcer and gastritis with prior cholecystectomy comes to the ER with complaints of abdominal pain and uncontrollable nausea and vomiting, seen on  5/11, 5/15 for same and left AMA on his first visit. He says he was not given an rx for pain or nausea medication when discharged from the hospital. He says that he did not smoke marijuana when he left the ER this time because he was told he is allergic to it. Patient is requesting Morphine or Dilaudid for pain and says that all other medications will not help him.  PCP: No primary care provider on file. Miguel Roman is a 25 y.o.  male  ROS: The patient denies diaphoresis, fever, headache, weakness (general or focal), confusion, change of vision,  dysphagia, aphagia, shortness of breath,  diarrhea, lower extremity swelling, rash, neck pain, chest pain   Past Medical History  Diagnosis Date  . Peptic ulcer   . Gastritis    Past Surgical History  Procedure Laterality Date  . Cholecystectomy     No family history on file. Social History  Substance Use Topics  . Smoking status: Current Every Day Smoker -- 1.00 packs/day  . Smokeless tobacco: None  . Alcohol Use: Yes    Review of Systems  Review of Systems All other systems negative except as documented in the HPI. All pertinent positives and negatives as reviewed in the HPI.   Allergies  Haldol; Thorazine; Toradol; Tramadol; Trazodone and nefazodone; and Penicillins  Home Medications   Prior to Admission medications   Medication Sig Start Date End Date Taking? Authorizing Provider  pantoprazole (PROTONIX) 20 MG tablet Take 1 tablet (20 mg total) by  mouth daily. 12/01/15   Rhylei Mcquaig Neva Seat, PA-C  promethazine (PHENERGAN) 25 MG tablet Take 1 tablet (25 mg total) by mouth every 6 (six) hours as needed for nausea or vomiting. 12/01/15   Tivon Lemoine Neva Seat, PA-C   BP 157/94 mmHg  Pulse 79  Temp(Src) 98.1 F (36.7 C) (Oral)  Resp 19  Ht 6' (1.829 m)  Wt 90.719 kg  BMI 27.12 kg/m2  SpO2 98% Physical Exam  Constitutional: He appears well-developed and well-nourished. No distress.  HENT:  Head: Normocephalic and atraumatic.  Right Ear: Tympanic membrane and ear canal normal.  Left Ear: Tympanic membrane and ear canal normal.  Nose: Nose normal.  Mouth/Throat: Uvula is midline, oropharynx is clear and moist and mucous membranes are normal.  Eyes: Pupils are equal, round, and reactive to light.  Neck: Normal range of motion. Neck supple.  Cardiovascular: Normal rate and regular rhythm.   Pulmonary/Chest: Effort normal.  Abdominal: Soft. Bowel sounds are normal. There is tenderness (diffuse but worse in the epigastric region). There is no rebound and no guarding.  No signs of abdominal distention  Musculoskeletal:  No LE swelling  Neurological: He is alert.  Acting at baseline  Skin: Skin is warm and dry. No rash noted.  Nursing note and vitals reviewed.   ED Course  Procedures (including critical care time) Labs Review Labs Reviewed  COMPREHENSIVE METABOLIC PANEL - Abnormal; Notable for the following:    Potassium 3.2 (*)    Glucose, Bld 118 (*)  All other components within normal limits  LIPASE, BLOOD - Abnormal; Notable for the following:    Lipase 265 (*)    All other components within normal limits  CBC WITH DIFFERENTIAL/PLATELET - Abnormal; Notable for the following:    WBC 11.3 (*)    Neutro Abs 9.1 (*)    All other components within normal limits    Imaging Review No results found. I have personally reviewed and evaluated these images and lab results as part of my medical decision-making.   EKG  Interpretation None     MDM   Final diagnoses:  Non-intractable vomiting with nausea, vomiting of unspecified type  Acute pancreatitis, unspecified pancreatitis type   12:52 am Discussed with patient early on in encounter that he will not be getting any narcotics during todays visit. Haldol 2 mg and IV protonix given to help with nausea. Lab work pending.  3: 56 am Patients labs show an elevated lipase at 265. He had a CT scan of his abd/pelv done for the same pain a few days ago which showed no acute abnormalities. His CBC and CMP are unremarkable. He was given 2 L of fluids, nausea medication and medication for the pain. He is resting comfortably. He was fluid challenged and monitored for an hour without any signs of vomiting. At this time I will recommend pt stay on a clear liquid diet and follow-up with GI.  Medications  oxyCODONE-acetaminophen (PERCOCET/ROXICET) 5-325 MG per tablet 1 tablet (0 tablets Oral Hold 12/01/15 0333)  sodium chloride 0.9 % bolus 1,000 mL (1,000 mLs Intravenous New Bag/Given 12/01/15 0027)  haloperidol lactate (HALDOL) injection 2 mg (2 mg Intravenous Given 12/01/15 0027)  pantoprazole (PROTONIX) injection 40 mg (40 mg Intravenous Given 12/01/15 0103)  sodium chloride 0.9 % bolus 1,000 mL (1,000 mLs Intravenous New Bag/Given 12/01/15 0226)  metoCLOPramide (REGLAN) injection 10 mg (10 mg Intravenous Given 12/01/15 0246)    I discussed results, diagnoses and plan with Miguel AltesEddie Roman. They voice there understanding and questions were answered. We discussed follow-up recommendations and return precautions.   Miguel Peliffany Isaul Landi, PA-C 12/01/15 0359  Dione Boozeavid Glick, MD 12/01/15 [phone]

## 2015-12-01 NOTE — Discharge Instructions (Signed)

## 2015-12-01 NOTE — ED Notes (Signed)
Patient left at this time with all belongings. 

## 2015-12-01 NOTE — ED Notes (Signed)
No vomiting after fluid challenge given, pt asleep at this time

## 2015-12-01 NOTE — ED Notes (Signed)
Given PO challenge 

## 2015-12-01 NOTE — ED Notes (Signed)
PA at bedside.

## 2016-01-13 ENCOUNTER — Encounter (HOSPITAL_COMMUNITY): Payer: Self-pay | Admitting: Emergency Medicine

## 2016-01-13 ENCOUNTER — Emergency Department (HOSPITAL_COMMUNITY)
Admission: EM | Admit: 2016-01-13 | Discharge: 2016-01-13 | Disposition: A | Discharge status: Home / Self Care | Payer: Self-pay | Attending: Emergency Medicine | Admitting: Emergency Medicine

## 2016-01-13 DIAGNOSIS — L0390 Cellulitis, unspecified: Secondary | ICD-10-CM

## 2016-01-13 DIAGNOSIS — F17200 Nicotine dependence, unspecified, uncomplicated: Secondary | ICD-10-CM | POA: Insufficient documentation

## 2016-01-13 DIAGNOSIS — L0291 Cutaneous abscess, unspecified: Secondary | ICD-10-CM

## 2016-01-13 DIAGNOSIS — L02214 Cutaneous abscess of groin: Secondary | ICD-10-CM | POA: Insufficient documentation

## 2016-01-13 DIAGNOSIS — Z79899 Other long term (current) drug therapy: Secondary | ICD-10-CM | POA: Insufficient documentation

## 2016-01-13 DIAGNOSIS — F1290 Cannabis use, unspecified, uncomplicated: Secondary | ICD-10-CM | POA: Insufficient documentation

## 2016-01-13 LAB — CBC WITH DIFFERENTIAL/PLATELET
Basophils Absolute: 0 10*3/uL (ref 0.0–0.1)
Basophils Relative: 0 %
EOS PCT: 1 %
Eosinophils Absolute: 0.1 10*3/uL (ref 0.0–0.7)
HCT: 40.8 % (ref 39.0–52.0)
Hemoglobin: 13.9 g/dL (ref 13.0–17.0)
LYMPHS ABS: 1.5 10*3/uL (ref 0.7–4.0)
LYMPHS PCT: 10 %
MCH: 28.7 pg (ref 26.0–34.0)
MCHC: 34.1 g/dL (ref 30.0–36.0)
MCV: 84.3 fL (ref 78.0–100.0)
MONO ABS: 1.3 10*3/uL — AB (ref 0.1–1.0)
MONOS PCT: 9 %
Neutro Abs: 12 10*3/uL — ABNORMAL HIGH (ref 1.7–7.7)
Neutrophils Relative %: 80 %
PLATELETS: 224 10*3/uL (ref 150–400)
RBC: 4.84 MIL/uL (ref 4.22–5.81)
RDW: 13.2 % (ref 11.5–15.5)
WBC: 14.9 10*3/uL — ABNORMAL HIGH (ref 4.0–10.5)

## 2016-01-13 LAB — BASIC METABOLIC PANEL
Anion gap: 4 — ABNORMAL LOW (ref 5–15)
BUN: 11 mg/dL (ref 6–20)
CALCIUM: 8.6 mg/dL — AB (ref 8.9–10.3)
CO2: 26 mmol/L (ref 22–32)
Chloride: 108 mmol/L (ref 101–111)
Creatinine, Ser: 0.85 mg/dL (ref 0.61–1.24)
GFR calc Af Amer: >60 mL/min (ref >60)
GLUCOSE: 93 mg/dL (ref 65–99)
Potassium: 3.8 mmol/L (ref 3.5–5.1)
Sodium: 138 mmol/L (ref 135–145)

## 2016-01-13 MED ORDER — IBUPROFEN 800 MG PO TABS
800.0000 mg | ORAL_TABLET | Freq: Once | ORAL | Status: AC
  Administered 2016-01-13: 800 mg via ORAL
  Filled 2016-01-13: qty 1

## 2016-01-13 MED ORDER — SULFAMETHOXAZOLE-TRIMETHOPRIM 800-160 MG PO TABS: 1.0000 | ORAL_TABLET | Freq: Two times a day (BID) | ORAL | Status: DC

## 2016-01-13 MED ORDER — LIDOCAINE-EPINEPHRINE-TETRACAINE (LET) SOLUTION
3.0000 mL | Freq: Once | NASAL | Status: AC
  Administered 2016-01-13: 3 mL via TOPICAL
  Filled 2016-01-13: qty 3

## 2016-01-13 MED ORDER — SULFAMETHOXAZOLE-TRIMETHOPRIM 800-160 MG PO TABS
1.0000 | ORAL_TABLET | Freq: Once | ORAL | Status: AC
  Administered 2016-01-13: 1 via ORAL
  Filled 2016-01-13: qty 1

## 2016-01-13 MED ORDER — CEPHALEXIN 500 MG PO CAPS: 500.0000 mg | ORAL_CAPSULE | Freq: Four times a day (QID) | ORAL | Status: AC

## 2016-01-13 MED ORDER — LIDOCAINE-EPINEPHRINE 2 %-1:100000 IJ SOLN
20.0000 mL | Freq: Once | INTRAMUSCULAR | Status: AC
  Administered 2016-01-13: 20 mL
  Filled 2016-01-13: qty 1

## 2016-01-13 MED ORDER — CEPHALEXIN 500 MG PO CAPS
500.0000 mg | ORAL_CAPSULE | Freq: Once | ORAL | Status: AC
Start: 2016-01-13 — End: 2016-01-13
  Administered 2016-01-13: 500 mg via ORAL
  Filled 2016-01-13: qty 1

## 2016-01-13 NOTE — ED Notes (Signed)
He noted "It looked like an infected hair" at right groin area "for a couple of days--last night I popped a little stuff out of it--and today it got real big and red and I have pain going down my leg from it".  He is in no distress.  He has an area of edema and erythema about the size of a ping-pong ball at right groin area without testicular/scrotal involvement.

## 2016-01-13 NOTE — ED Notes (Signed)
Bed: [id number] Expected date:  Expected time:  Means of arrival:  Comments: 25 yo groin abscess

## 2016-01-13 NOTE — ED Provider Notes (Signed)
CSN: [id number]     Arrival date & time 01/13/16  1411 History   First MD Initiated Contact with Patient 01/13/16 1417     Chief Complaint  Patient presents with  . Abscess     (Consider location/radiation/quality/duration/timing/severity/associated sxs/prior Treatment) HPI 25 year old male who presents with abscess. He has a history of peptic ulcer disease and polysubstance abuse. States that he noticed a bump over his right groin yesterday, and pulled the hair out of the bump last night. This morning woke up with redness, swelling, and pain over the skin around the area where he pulled a hair. Has not had any nausea or vomiting, fevers or chills. Past Medical History  Diagnosis Date  . Peptic ulcer   . Gastritis    Past Surgical History  Procedure Laterality Date  . Cholecystectomy     History reviewed. No pertinent family history. Social History  Substance Use Topics  . Smoking status: Current Every Day Smoker -- 1.00 packs/day  . Smokeless tobacco: None  . Alcohol Use: Yes    Review of Systems 10/14 systems reviewed and are negative other than those stated in the HPI   Allergies  Haldol; Thorazine; Toradol; Tramadol; Trazodone and nefazodone; and Penicillins  Home Medications   Prior to Admission medications   Medication Sig Start Date End Date Taking? Authorizing Provider  DICYCLOMINE HCL PO Take by mouth as directed.    Yes Historical Provider, MD  pantoprazole (PROTONIX) 20 MG tablet Take 1 tablet (20 mg total) by mouth daily. 12/01/15  Yes Tiffany Neva SeatGreene, PA-C  cephALEXin (KEFLEX) 500 MG capsule Take 1 capsule (500 mg total) by mouth 4 (four) times daily. 01/13/16   Lavera Guiseana Duo Giancarlos Berendt, MD  promethazine (PHENERGAN) 25 MG tablet Take 1 tablet (25 mg total) by mouth every 6 (six) hours as needed for nausea or vomiting. Patient not taking: Reported on 01/13/2016 12/01/15   Marlon Peliffany Greene, PA-C  sulfamethoxazole-trimethoprim (BACTRIM DS,SEPTRA DS) 800-160 MG tablet Take 1 tablet by  mouth 2 (two) times daily. 01/13/16 01/20/16  Lavera Guiseana Duo Braylyn Eye, MD   BP 130/70 mmHg  Pulse 68  Temp(Src) 98.4 F (36.9 C) (Oral)  Resp 16  SpO2 100% Physical Exam Physical Exam  Nursing note and vitals reviewed. Constitutional: Well developed, well nourished, non-toxic, and in no acute distress Head: Normocephalic and atraumatic.  Mouth/Throat: Oropharynx is clear and moist.  Neck: Normal range of motion. Neck supple.  Cardiovascular: Normal rate and regular rhythm.   Pulmonary/Chest: Effort normal and breath sounds normal.  Abdominal: Soft. There is no tenderness. There is no rebound and no guarding.  Musculoskeletal: There is area of erythema, warmth, and induration over the right mons pubis  Neurological: Alert, no facial droop, fluent speech, moves all extremities symmetrically Skin: Skin is warm and dry.  Psychiatric: Cooperative  ED Course  .Marland Kitchen.Incision and Drainage Date/Time: 01/13/2016 3:57 PM Performed by: Crista CurbLIU, Sherisse Fullilove DUO Authorized by: Crista CurbLIU, Aireana Ryland DUO Consent: Verbal consent obtained. Risks and benefits: risks, benefits and alternatives were discussed Consent given by: patient Patient identity confirmed: verbally with patient Time out: Immediately prior to procedure a "time out" was called to verify the correct patient, procedure, equipment, support staff and site/side marked as required. Type: abscess Body area: anogenital (right groin (mons pubis)) Anesthesia: local infiltration Local anesthetic: lidocaine 2% with epinephrine Anesthetic total: 3 ml Patient sedated: no Scalpel size: 11 Incision type: single straight Incision depth: subcutaneous Complexity: simple Drainage: purulent Drainage amount: moderate Wound treatment: wound left open Packing material: 1/2 in  iodoform gauze Patient tolerance: Patient tolerated the procedure well with no immediate complications   (including critical care time) Labs Review Labs Reviewed  CBC WITH DIFFERENTIAL/PLATELET - Abnormal;  Notable for the following:    WBC 14.9 (*)    Neutro Abs 12.0 (*)    Monocytes Absolute 1.3 (*)    All other components within normal limits  BASIC METABOLIC PANEL - Abnormal; Notable for the following:    Calcium 8.6 (*)    Anion gap 4 (*)    All other components within normal limits    Imaging Review No results found. I have personally reviewed and evaluated these images and lab results as part of my medical decision-making.   EKG Interpretation None     EMERGENCY DEPARTMENT US SOFT TISSUE INTERPRETATION "Study: Limited Ultrasound of the noted body part in comments below"  INDICATIONS: Soft tissue infection Multiple views of the body part are obtained with a multi-frequency linear probe  PERFORMED BY:  Myself  IMAGES ARCHIVED?: No  SIDE:Right   BODY PART:Pelvic wall and Lower extremity  FINDINGS: Abcess present and Cellulitis present  LIMITATIONS: n/a  INTERPRETATION:  Abcess present and Cellulitis present  COMMENT:  n/a   MDM   Final diagnoses:  Abscess and cellulitis    Presenting with cellulitis and abscess of the right groin after pulling ingrown hair. Afebrile w/o systemic signs or symptoms of illness. No diabetes and not immune suppressed. US at bedside reveals abscess with surrounding cellulitis. I&D. With leukocytosis but no other sepsis criteria. Started on keflex and bactrim. Discussed wound recheck and packing removal in 1-2 days. Strict return and follow-up instructions reviewed. He expressed understanding of all discharge instructions and felt comfortable with the plan of care.     Lavera Guiseana Duo Kealohilani Maiorino, MD 01/13/16 [id number]

## 2016-01-13 NOTE — ED Notes (Signed)
Dr. Verdie MosherLiu applied gauze dressing after I & D.  I issue him some abd's to use at home.  He verbalizes understanding of importance of antibiotics and to come back here for recheck in 1-2 days--sooner if any problems such as fever/pain/swelling.

## 2016-01-13 NOTE — Discharge Instructions (Signed)
Please have wound recheck in 1-2 days to make sure your infection is improved either back in ED or at urgent care. Your packing can be removed in 1-2 days. Take antibiotics as prescribed. Return without fail for worsening symptoms, including worsening redness/swelling and fever despite antibiotics or any other symptoms concerning to you.  Abscess An abscess is an infected area that contains a collection of pus and debris.It can occur in almost any part of the body. An abscess is also known as a furuncle or boil. CAUSES  An abscess occurs when tissue gets infected. This can occur from blockage of oil or sweat glands, infection of hair follicles, or a minor injury to the skin. As the body tries to fight the infection, pus collects in the area and creates pressure under the skin. This pressure causes pain. People with weakened immune systems have difficulty fighting infections and get certain abscesses more often.  SYMPTOMS Usually an abscess develops on the skin and becomes a painful mass that is red, warm, and tender. If the abscess forms under the skin, you may feel a moveable soft area under the skin. Some abscesses break open (rupture) on their own, but most will continue to get worse without care. The infection can spread deeper into the body and eventually into the bloodstream, causing you to feel ill.  DIAGNOSIS  Your caregiver will take your medical history and perform a physical exam. A sample of fluid may also be taken from the abscess to determine what is causing your infection. TREATMENT  Your caregiver may prescribe antibiotic medicines to fight the infection. However, taking antibiotics alone usually does not cure an abscess. Your caregiver may need to make a small cut (incision) in the abscess to drain the pus. In some cases, gauze is packed into the abscess to reduce pain and to continue draining the area. HOME CARE INSTRUCTIONS   Only take over-the-counter or prescription medicines for  pain, discomfort, or fever as directed by your caregiver.  If you were prescribed antibiotics, take them as directed. Finish them even if you start to feel better.  If gauze is used, follow your caregiver's directions for changing the gauze.  To avoid spreading the infection:  Keep your draining abscess covered with a bandage.  Wash your hands well.  Do not share personal care items, towels, or whirlpools with others.  Avoid skin contact with others.  Keep your skin and clothes clean around the abscess.  Keep all follow-up appointments as directed by your caregiver. SEEK MEDICAL CARE IF:   You have increased pain, swelling, redness, fluid drainage, or bleeding.  You have muscle aches, chills, or a general ill feeling.  You have a fever. MAKE SURE YOU:   Understand these instructions.  Will watch your condition.  Will get help right away if you are not doing well or get worse.   This information is not intended to replace advice given to you by your health care provider. Make sure you discuss any questions you have with your health care provider.   Document Released: 04/10/2005 Document Revised: 12/31/2011 Document Reviewed: 09/13/2011 Elsevier Interactive Patient Education 2016 Elsevier Inc.  Cellulitis Cellulitis is an infection of the skin and the tissue beneath it. The infected area is usually red and tender. Cellulitis occurs most often in the arms and lower legs.  CAUSES  Cellulitis is caused by bacteria that enter the skin through cracks or cuts in the skin. The most common types of bacteria that cause cellulitis  are staphylococci and streptococci. SIGNS AND SYMPTOMS   Redness and warmth.  Swelling.  Tenderness or pain.  Fever. DIAGNOSIS  Your health care provider can usually determine what is wrong based on a physical exam. Blood tests may also be done. TREATMENT  Treatment usually involves taking an antibiotic medicine. HOME CARE INSTRUCTIONS   Take  your antibiotic medicine as directed by your health care provider. Finish the antibiotic even if you start to feel better.  Keep the infected arm or leg elevated to reduce swelling.  Apply a warm cloth to the affected area up to 4 times per day to relieve pain.  Take medicines only as directed by your health care provider.  Keep all follow-up visits as directed by your health care provider. SEEK MEDICAL CARE IF:   You notice red streaks coming from the infected area.  Your red area gets larger or turns dark in color.  Your bone or joint underneath the infected area becomes painful after the skin has healed.  Your infection returns in the same area or another area.  You notice a swollen bump in the infected area.  You develop new symptoms.  You have a fever. SEEK IMMEDIATE MEDICAL CARE IF:   You feel very sleepy.  You develop vomiting or diarrhea.  You have a general ill feeling (malaise) with muscle aches and pains.   This information is not intended to replace advice given to you by your health care provider. Make sure you discuss any questions you have with your health care provider.   Document Released: 04/10/2005 Document Revised: 03/22/2015 Document Reviewed: 09/16/2011 Elsevier Interactive Patient Education [employer]2016 Elsevier Inc.

## 2016-01-18 ENCOUNTER — Inpatient Hospital Stay (HOSPITAL_COMMUNITY)
Admission: EM | Admit: 2016-01-18 | Discharge: 2016-01-22 | DRG: 683 | Disposition: A | Discharge status: Home / Self Care | Attending: Internal Medicine | Admitting: Internal Medicine

## 2016-01-18 ENCOUNTER — Emergency Department (HOSPITAL_COMMUNITY)

## 2016-01-18 ENCOUNTER — Encounter (HOSPITAL_COMMUNITY): Payer: Self-pay | Admitting: Emergency Medicine

## 2016-01-18 DIAGNOSIS — E871 Hypo-osmolality and hyponatremia: Secondary | ICD-10-CM | POA: Diagnosis present

## 2016-01-18 DIAGNOSIS — J982 Interstitial emphysema: Secondary | ICD-10-CM

## 2016-01-18 DIAGNOSIS — Z888 Allergy status to other drugs, medicaments and biological substances status: Secondary | ICD-10-CM

## 2016-01-18 DIAGNOSIS — D72829 Elevated white blood cell count, unspecified: Secondary | ICD-10-CM | POA: Diagnosis present

## 2016-01-18 DIAGNOSIS — R1110 Vomiting, unspecified: Secondary | ICD-10-CM | POA: Diagnosis not present

## 2016-01-18 DIAGNOSIS — R112 Nausea with vomiting, unspecified: Secondary | ICD-10-CM | POA: Diagnosis present

## 2016-01-18 DIAGNOSIS — Z880 Allergy status to penicillin: Secondary | ICD-10-CM

## 2016-01-18 DIAGNOSIS — E872 Acidosis: Secondary | ICD-10-CM

## 2016-01-18 DIAGNOSIS — Z885 Allergy status to narcotic agent status: Secondary | ICD-10-CM

## 2016-01-18 DIAGNOSIS — Z79899 Other long term (current) drug therapy: Secondary | ICD-10-CM | POA: Diagnosis not present

## 2016-01-18 DIAGNOSIS — L02214 Cutaneous abscess of groin: Secondary | ICD-10-CM | POA: Diagnosis present

## 2016-01-18 DIAGNOSIS — T797XXA Traumatic subcutaneous emphysema, initial encounter: Secondary | ICD-10-CM | POA: Diagnosis present

## 2016-01-18 DIAGNOSIS — E873 Alkalosis: Secondary | ICD-10-CM | POA: Diagnosis present

## 2016-01-18 DIAGNOSIS — R1013 Epigastric pain: Secondary | ICD-10-CM | POA: Diagnosis present

## 2016-01-18 DIAGNOSIS — E878 Other disorders of electrolyte and fluid balance, not elsewhere classified: Secondary | ICD-10-CM | POA: Diagnosis present

## 2016-01-18 DIAGNOSIS — Z8711 Personal history of peptic ulcer disease: Secondary | ICD-10-CM | POA: Diagnosis not present

## 2016-01-18 DIAGNOSIS — X58XXXA Exposure to other specified factors, initial encounter: Secondary | ICD-10-CM | POA: Diagnosis present

## 2016-01-18 DIAGNOSIS — R001 Bradycardia, unspecified: Secondary | ICD-10-CM | POA: Diagnosis present

## 2016-01-18 DIAGNOSIS — E876 Hypokalemia: Secondary | ICD-10-CM

## 2016-01-18 DIAGNOSIS — R52 Pain, unspecified: Secondary | ICD-10-CM

## 2016-01-18 DIAGNOSIS — R079 Chest pain, unspecified: Secondary | ICD-10-CM

## 2016-01-18 DIAGNOSIS — N179 Acute kidney failure, unspecified: Principal | ICD-10-CM

## 2016-01-18 DIAGNOSIS — F1910 Other psychoactive substance abuse, uncomplicated: Secondary | ICD-10-CM

## 2016-01-18 LAB — CBC WITH DIFFERENTIAL/PLATELET
BASOS ABS: 0 10*3/uL (ref 0.0–0.1)
Basophils Relative: 0 %
Eosinophils Absolute: 0 10*3/uL (ref 0.0–0.7)
Eosinophils Relative: 0 %
HEMATOCRIT: 40.1 % (ref 39.0–52.0)
HEMOGLOBIN: 15 g/dL (ref 13.0–17.0)
LYMPHS PCT: 15 %
Lymphs Abs: 2 10*3/uL (ref 0.7–4.0)
MCH: 29.2 pg (ref 26.0–34.0)
MCHC: 37.4 g/dL — ABNORMAL HIGH (ref 30.0–36.0)
MCV: 78 fL (ref 78.0–100.0)
MONOS PCT: 12 %
Monocytes Absolute: 1.6 10*3/uL — ABNORMAL HIGH (ref 0.1–1.0)
NEUTROS ABS: 9.8 10*3/uL — AB (ref 1.7–7.7)
Neutrophils Relative %: 73 %
Platelets: 335 10*3/uL (ref 150–400)
RBC: 5.14 MIL/uL (ref 4.22–5.81)
RDW: 12.4 % (ref 11.5–15.5)
WBC: 13.4 10*3/uL — AB (ref 4.0–10.5)

## 2016-01-18 LAB — COMPREHENSIVE METABOLIC PANEL
ALBUMIN: 4.7 g/dL (ref 3.5–5.0)
ALT: 28 U/L (ref 17–63)
ANION GAP: 16 — AB (ref 5–15)
AST: 54 U/L — ABNORMAL HIGH (ref 15–41)
Alkaline Phosphatase: 53 U/L (ref 38–126)
BILIRUBIN TOTAL: 1.6 mg/dL — AB (ref 0.3–1.2)
BUN: 55 mg/dL — AB (ref 6–20)
CO2: 20 mmol/L — ABNORMAL LOW (ref 22–32)
Calcium: 8.7 mg/dL — ABNORMAL LOW (ref 8.9–10.3)
Chloride: 96 mmol/L — ABNORMAL LOW (ref 101–111)
Creatinine, Ser: 1.87 mg/dL — ABNORMAL HIGH (ref 0.61–1.24)
GFR calc non Af Amer: 48 mL/min — ABNORMAL LOW (ref >60)
GFR, EST AFRICAN AMERICAN: 56 mL/min — AB (ref >60)
Glucose, Bld: 109 mg/dL — ABNORMAL HIGH (ref 65–99)
POTASSIUM: 3.3 mmol/L — AB (ref 3.5–5.1)
Sodium: 132 mmol/L — ABNORMAL LOW (ref 135–145)
Total Protein: 8.4 g/dL — ABNORMAL HIGH (ref 6.5–8.1)

## 2016-01-18 LAB — LIPASE, BLOOD: LIPASE: 49 U/L (ref 11–51)

## 2016-01-18 LAB — MRSA PCR SCREENING: MRSA by PCR: NEGATIVE

## 2016-01-18 MED ORDER — FAMOTIDINE IN NACL 20-0.9 MG/50ML-% IV SOLN
20.0000 mg | Freq: Two times a day (BID) | INTRAVENOUS | Status: DC
  Administered 2016-01-18 – 2016-01-22 (×8): 20 mg via INTRAVENOUS
  Filled 2016-01-18 (×10): qty 50

## 2016-01-18 MED ORDER — ONDANSETRON HCL 4 MG/2ML IJ SOLN
4.0000 mg | INTRAMUSCULAR | Status: DC | PRN
  Administered 2016-01-18 (×2): 4 mg via INTRAVENOUS
  Filled 2016-01-18 (×2): qty 2

## 2016-01-18 MED ORDER — FAMOTIDINE IN NACL 20-0.9 MG/50ML-% IV SOLN
20.0000 mg | Freq: Once | INTRAVENOUS | Status: AC
  Administered 2016-01-18: 20 mg via INTRAVENOUS
  Filled 2016-01-18: qty 50

## 2016-01-18 MED ORDER — HEPARIN SODIUM (PORCINE) 5000 UNIT/ML IJ SOLN
5000.0000 [IU] | Freq: Three times a day (TID) | INTRAMUSCULAR | Status: DC
  Administered 2016-01-18 – 2016-01-21 (×9): 5000 [IU] via SUBCUTANEOUS
  Filled 2016-01-18 (×10): qty 1

## 2016-01-18 MED ORDER — SODIUM CHLORIDE 0.9 % IV BOLUS (SEPSIS)
1000.0000 mL | Freq: Once | INTRAVENOUS | Status: AC
  Administered 2016-01-18: 1000 mL via INTRAVENOUS

## 2016-01-18 MED ORDER — VANCOMYCIN HCL IN DEXTROSE 1-5 GM/200ML-% IV SOLN
1000.0000 mg | Freq: Two times a day (BID) | INTRAVENOUS | Status: DC
  Administered 2016-01-18 – 2016-01-19 (×2): 1000 mg via INTRAVENOUS
  Filled 2016-01-18 (×3): qty 200

## 2016-01-18 MED ORDER — SODIUM CHLORIDE 0.9 % IV SOLN
INTRAVENOUS | Status: DC
  Administered 2016-01-18: 17:00:00 via INTRAVENOUS

## 2016-01-18 MED ORDER — PROMETHAZINE HCL 25 MG/ML IJ SOLN
12.5000 mg | Freq: Four times a day (QID) | INTRAMUSCULAR | Status: DC | PRN
Start: 2016-01-18 — End: 2016-01-22
  Administered 2016-01-19 – 2016-01-20 (×3): 12.5 mg via INTRAVENOUS
  Filled 2016-01-18 (×5): qty 1

## 2016-01-18 MED ORDER — DEXTROSE 5 % IV SOLN
1.0000 g | INTRAVENOUS | Status: DC
  Administered 2016-01-18: 1 g via INTRAVENOUS
  Filled 2016-01-18: qty 10

## 2016-01-18 MED ORDER — MORPHINE SULFATE (PF) 2 MG/ML IV SOLN
1.0000 mg | INTRAVENOUS | Status: DC | PRN
  Administered 2016-01-19 – 2016-01-21 (×8): 1 mg via INTRAVENOUS
  Filled 2016-01-18 (×9): qty 1

## 2016-01-18 MED ORDER — IOPAMIDOL (ISOVUE-300) INJECTION 61%
75.0000 mL | Freq: Once | INTRAVENOUS | Status: AC | PRN
  Administered 2016-01-18: 60 mL via INTRAVENOUS

## 2016-01-18 MED ORDER — ACETAMINOPHEN 500 MG PO TABS: 500.0000 mg | ORAL_TABLET | Freq: Four times a day (QID) | ORAL | Status: DC | PRN

## 2016-01-18 MED ORDER — POTASSIUM CHLORIDE CRYS ER 20 MEQ PO TBCR
40.0000 meq | EXTENDED_RELEASE_TABLET | Freq: Once | ORAL | Status: AC
  Administered 2016-01-18: 40 meq via ORAL
  Filled 2016-01-18: qty 2

## 2016-01-18 MED ORDER — HALOPERIDOL LACTATE 5 MG/ML IJ SOLN
2.0000 mg | Freq: Once | INTRAMUSCULAR | Status: AC
  Administered 2016-01-18: 2 mg via INTRAVENOUS
  Filled 2016-01-18: qty 1

## 2016-01-18 MED ORDER — DEXTROSE-NACL 5-0.9 % IV SOLN
INTRAVENOUS | Status: DC
  Administered 2016-01-18 – 2016-01-22 (×7): via INTRAVENOUS

## 2016-01-18 MED ORDER — PROMETHAZINE HCL 25 MG/ML IJ SOLN
12.5000 mg | Freq: Once | INTRAMUSCULAR | Status: AC
  Administered 2016-01-18: 12.5 mg via INTRAVENOUS
  Filled 2016-01-18: qty 1

## 2016-01-18 MED ORDER — POTASSIUM CHLORIDE 10 MEQ/100ML IV SOLN: 10.0000 meq | INTRAVENOUS | Status: DC

## 2016-01-18 MED ORDER — ONDANSETRON HCL 4 MG/2ML IJ SOLN
4.0000 mg | Freq: Once | INTRAMUSCULAR | Status: AC
Start: 2016-01-18 — End: 2016-01-18
  Administered 2016-01-18: 4 mg via INTRAVENOUS
  Filled 2016-01-18: qty 2

## 2016-01-18 MED ORDER — SODIUM CHLORIDE 0.9% FLUSH
3.0000 mL | Freq: Two times a day (BID) | INTRAVENOUS | Status: DC
  Administered 2016-01-18 – 2016-01-19 (×2): 3 mL via INTRAVENOUS
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
## 2016-01-18 NOTE — ED Notes (Signed)
Pt arrives with [employer] from jail.  He is handcuffed.  Hx of gastritis with active vomiting.  Pt is calm and cooperative.
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
## 2016-01-18 NOTE — Progress Notes (Signed)
Pharmacy Antibiotic Note  Miguel Roman is a 25 y.o. male admitted on 01/18/2016 with severe nausea and vomiting.  During initial assessment found to have cellulitis in right groin area.  Pharmacy initially consulted for vanc and zosyn.  Pt has an allergy to PCN but has tolerated cephalosporins in the past.  Zosyn changed to ceftriaxone.  SCr  1.87, CrCl ~ 660mls/min (N)  Plan: Ceftriaxone 1gm IV q24h Vancomycin 1000mg  IV q12h Follow renal function, clinical course vanc trough at steady state as needed     Temp (24hrs), Avg:98.5 F (36.9 C), Min:98.1 F (36.7 C), Max:98.8 F (37.1 C)   Recent Labs Lab 01/13/16 1506 01/18/16 1334  WBC 14.9* 13.4*  CREATININE 0.85 1.87*    CrCl cannot be calculated (Unknown ideal weight.).    Allergies  Allergen Reactions  . Haldol [Haloperidol] Swelling    Tolerated in ED 5/16  . Thorazine [Chlorpromazine]   . Toradol [Ketorolac Tromethamine]   . Tramadol   . Trazodone And Nefazodone   . Penicillins Hives and Rash    Has patient had a PCN reaction causing immediate rash, facial/tongue/throat swelling, SOB or lightheadedness with hypotension: No Has patient had a PCN reaction causing severe rash involving mucus membranes or skin necrosis: No Has patient had a PCN reaction that required hospitalization No Has patient had a PCN reaction occurring within the last 10 years: No If all of the above answers are "NO", then may proceed with Cephalosporin use.     Antimicrobials this admission: 7/6 ceftriaxone>> 7/6 vanc >>    Thank you for allowing pharmacy to be a part of this patient's care.  Arley PhenixEllen Onie Hayashi RPh 01/18/2016, 7:25 PM Pager [phone]
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
## 2016-01-18 NOTE — ED Notes (Signed)
Pt reports vomiting non stop for the last 4 days

## 2016-01-19 ENCOUNTER — Inpatient Hospital Stay (HOSPITAL_COMMUNITY)

## 2016-01-19 DIAGNOSIS — R1110 Vomiting, unspecified: Secondary | ICD-10-CM

## 2016-01-19 LAB — COMPREHENSIVE METABOLIC PANEL
ALT: 22 U/L (ref 17–63)
AST: 39 U/L (ref 15–41)
Albumin: 3.9 g/dL (ref 3.5–5.0)
Alkaline Phosphatase: 40 U/L (ref 38–126)
Anion gap: 7 (ref 5–15)
BUN: 26 mg/dL — ABNORMAL HIGH (ref 6–20)
CHLORIDE: 103 mmol/L (ref 101–111)
CO2: 25 mmol/L (ref 22–32)
Calcium: 8.2 mg/dL — ABNORMAL LOW (ref 8.9–10.3)
Creatinine, Ser: 1.01 mg/dL (ref 0.61–1.24)
Glucose, Bld: 115 mg/dL — ABNORMAL HIGH (ref 65–99)
POTASSIUM: 3.1 mmol/L — AB (ref 3.5–5.1)
SODIUM: 135 mmol/L (ref 135–145)
Total Bilirubin: 1 mg/dL (ref 0.3–1.2)
Total Protein: 6.8 g/dL (ref 6.5–8.1)

## 2016-01-19 MED ORDER — DEXTROSE 5 % IV SOLN
1.0000 g | INTRAVENOUS | Status: DC
  Administered 2016-01-19 – 2016-01-21 (×3): 1 g via INTRAVENOUS
  Filled 2016-01-19 (×4): qty 10

## 2016-01-19 MED ORDER — MORPHINE SULFATE (PF) 2 MG/ML IV SOLN
2.0000 mg | Freq: Once | INTRAVENOUS | Status: AC
  Administered 2016-01-19: 2 mg via INTRAVENOUS
  Filled 2016-01-19: qty 1

## 2016-01-19 MED ORDER — POTASSIUM CHLORIDE 10 MEQ/100ML IV SOLN
10.0000 meq | INTRAVENOUS | Status: AC
  Administered 2016-01-19 – 2016-01-20 (×5): 10 meq via INTRAVENOUS
  Filled 2016-01-19: qty 100

## 2016-01-19 MED ORDER — DIATRIZOATE MEGLUMINE & SODIUM 66-10 % PO SOLN
90.0000 mL | Freq: Once | ORAL | Status: AC
  Administered 2016-01-19: 25 mL via ORAL
  Filled 2016-01-19: qty 90

## 2016-01-19 MED ORDER — ONDANSETRON HCL 4 MG/2ML IJ SOLN
4.0000 mg | INTRAMUSCULAR | Status: DC | PRN
Start: 2016-01-19 — End: 2016-01-22
  Administered 2016-01-19 – 2016-01-21 (×9): 4 mg via INTRAVENOUS
  Filled 2016-01-19 (×9): qty 2

## 2016-01-19 MED ORDER — POTASSIUM CHLORIDE 10 MEQ/100ML IV SOLN
10.0000 meq | INTRAVENOUS | Status: AC
  Administered 2016-01-19 (×3): 10 meq via INTRAVENOUS
  Filled 2016-01-19: qty 100

## 2016-01-19 MED ORDER — HYDROMORPHONE HCL 1 MG/ML IJ SOLN
1.0000 mg | Freq: Once | INTRAMUSCULAR | Status: AC
  Administered 2016-01-19: 1 mg via INTRAVENOUS
  Filled 2016-01-19: qty 1
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
## 2016-01-19 NOTE — Progress Notes (Addendum)
LCSWA aware of SW consult. [facility] [place] [facility] [place] [facility] [place] [facility] [place] [place] LCSWA signing off at this time.  Otis Peakicole Carlicia Leavens, LCSWA, MSW Clinical Social Worker 5E and Psychiatric Service Line [phone] 01/19/2016  1:07 PM
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
## 2016-01-20 ENCOUNTER — Inpatient Hospital Stay (HOSPITAL_COMMUNITY)

## 2016-01-20 LAB — CBC WITH DIFFERENTIAL/PLATELET
BASOS ABS: 0 10*3/uL (ref 0.0–0.1)
BASOS PCT: 0 %
EOS ABS: 0.2 10*3/uL (ref 0.0–0.7)
Eosinophils Relative: 3 %
HEMATOCRIT: 35.1 % — AB (ref 39.0–52.0)
HEMOGLOBIN: 12.2 g/dL — AB (ref 13.0–17.0)
Lymphocytes Relative: 38 %
Lymphs Abs: 2.3 10*3/uL (ref 0.7–4.0)
MCH: 28.3 pg (ref 26.0–34.0)
MCHC: 34.8 g/dL (ref 30.0–36.0)
MCV: 81.4 fL (ref 78.0–100.0)
Monocytes Absolute: 0.7 10*3/uL (ref 0.1–1.0)
Monocytes Relative: 11 %
NEUTROS ABS: 3 10*3/uL (ref 1.7–7.7)
NEUTROS PCT: 48 %
Platelets: 260 10*3/uL (ref 150–400)
RBC: 4.31 MIL/uL (ref 4.22–5.81)
RDW: 12.4 % (ref 11.5–15.5)
WBC: 6.1 10*3/uL (ref 4.0–10.5)

## 2016-01-20 LAB — BASIC METABOLIC PANEL
ANION GAP: 5 (ref 5–15)
BUN: 16 mg/dL (ref 6–20)
CALCIUM: 8 mg/dL — AB (ref 8.9–10.3)
CHLORIDE: 101 mmol/L (ref 101–111)
CO2: 26 mmol/L (ref 22–32)
CREATININE: 0.85 mg/dL (ref 0.61–1.24)
GFR calc non Af Amer: >60 mL/min (ref >60)
Glucose, Bld: 93 mg/dL (ref 65–99)
Potassium: 3.3 mmol/L — ABNORMAL LOW (ref 3.5–5.1)
SODIUM: 132 mmol/L — AB (ref 135–145)

## 2016-01-20 MED ORDER — MORPHINE SULFATE (PF) 2 MG/ML IV SOLN
1.0000 mg | Freq: Once | INTRAVENOUS | Status: AC
Start: 2016-01-20 — End: 2016-01-20
  Administered 2016-01-20: 1 mg via INTRAVENOUS

## 2016-01-20 MED ORDER — ONDANSETRON HCL 4 MG/5ML PO SOLN: 4.0000 mg | ORAL | Status: AC | PRN

## 2016-01-20 MED ORDER — POTASSIUM CHLORIDE CRYS ER 20 MEQ PO TBCR
40.0000 meq | EXTENDED_RELEASE_TABLET | Freq: Two times a day (BID) | ORAL | Status: DC
Start: 2016-01-20 — End: 2016-01-21
  Administered 2016-01-20 – 2016-01-21 (×3): 40 meq via ORAL
  Filled 2016-01-20 (×3): qty 2

## 2016-01-20 MED ORDER — MORPHINE SULFATE 25 MG/ML IV SOLN: 1.0000 mg/h | INTRAVENOUS | Status: DC

## 2016-01-20 NOTE — Progress Notes (Signed)
Notified physician that patient vomiting and reports pain after eating full liquid lunch.  Physician putting in order for one time dose Morphine and cancelling discharge for today.

## 2016-01-20 NOTE — Progress Notes (Signed)
Notified physician that patient is nauseated, vomiting, and incontinent of stool after eating applesauce and taking medication.  Physician orders give PRN dose of Phenergan IV and notify him after lunch about patient tolerance of food.  Physician orders full liquid diet verbally.
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
## 2016-01-21 LAB — BASIC METABOLIC PANEL
ANION GAP: 7 (ref 5–15)
BUN: 6 mg/dL (ref 6–20)
CALCIUM: 8.3 mg/dL — AB (ref 8.9–10.3)
CHLORIDE: 102 mmol/L (ref 101–111)
CO2: 26 mmol/L (ref 22–32)
CREATININE: 0.74 mg/dL (ref 0.61–1.24)
GFR calc non Af Amer: >60 mL/min (ref >60)
Glucose, Bld: 75 mg/dL (ref 65–99)
Potassium: 3.9 mmol/L (ref 3.5–5.1)
SODIUM: 135 mmol/L (ref 135–145)

## 2016-01-21 MED ORDER — ENSURE ENLIVE PO LIQD
237.0000 mL | Freq: Two times a day (BID) | ORAL | Status: DC
  Administered 2016-01-22: 237 mL via ORAL
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
## 2016-01-22 ENCOUNTER — Other Ambulatory Visit: Payer: Self-pay

## 2016-01-22 DIAGNOSIS — R1110 Vomiting, unspecified: Secondary | ICD-10-CM | POA: Insufficient documentation

## 2016-01-22 NOTE — Progress Notes (Signed)
PROGRESS NOTE    Miguel Heckddie XXXLocklear  ZOX:[id number] DOB: [birth year] DOA: 01/18/2016 PCP: No primary care provider on file.    Brief Narrative: Intractable nausea and vomiting complicated by pneumomediastinum. 25 yo male from jail, cannabinoid use. Esophagogram no injury. Discharged canceled yesterday due to recurrent vomiting  Assessment & Plan:   Active Problems:   AKI (acute kidney injury) (HCC)   Pneumomediastinum (HCC)   1.Gastroenterology. Yesterday patient had recurrent nausea and vomiting and had to cancel discharge. Overnight symptoms have improve, will plan to discharge him today.    DVT prophylaxis: heparin Code Status: full Family Communication:  Disposition Plan: Discharge today, please refer to discharge summary from yesterday on 01/20/16.   Consultants:     Procedures:  Antimicrobials:  Subjective: Patient feeling better, no nausea or vomiting overnight. No chest pain or abdominal pain.  Objective: Filed Vitals:   01/21/16 0449 01/21/16 1500 01/21/16 2102 01/22/16 0642  BP: 109/69 126/79 108/52 139/70  Pulse: 42 73 60 60  Temp: 97.6 F (36.4 C) 99 F (37.2 C) 97.9 F (36.6 C) 98 F (36.7 C)  TempSrc: Oral Oral Oral Oral  Resp: 16 18 17 18   Height:      Weight:      SpO2: 100% 100% 100% 100%    Intake/Output Summary (Last 24 hours) at 01/22/16 1119 Last data filed at 01/22/16 0944  Gross per 24 hour  Intake   1580 ml  Output   2050 ml  Net   -470 ml   Filed Weights   01/18/16 1900  Weight: 83.915 kg (185 lb)    Examination:  General exam: Appears calm and comfortable  Respiratory system: Clear to auscultation. Respiratory effort normal. Cardiovascular system: S1 & S2 heard, RRR. No JVD, murmurs, rubs, gallops or clicks. No pedal edema. Gastrointestinal system: Abdomen is nondistended, soft and nontender. No organomegaly or masses felt. Normal bowel sounds heard. Central nervous system: Alert and oriented. No focal neurological  deficits. Extremities: Symmetric 5 x 5 power. Skin: No rashes, lesions or ulcers Psychiatry: Judgement and insight appear normal. Mood & affect appropriate.     Data Reviewed: I have personally reviewed following labs and imaging studies  CBC:  Recent Labs Lab 01/18/16 1334 01/20/16 0454  WBC 13.4* 6.1  NEUTROABS 9.8* 3.0  HGB 15.0 12.2*  HCT 40.1 35.1*  MCV 78.0 81.4  PLT 335 260   Basic Metabolic Panel:  Recent Labs Lab 01/18/16 1334 01/19/16 0450 01/20/16 0454 01/21/16 1030  NA 132* 135 132* 135  K 3.3* 3.1* 3.3* 3.9  CL 96* 103 101 102  CO2 20* 25 26 26   GLUCOSE 109* 115* 93 75  BUN 55* 26* 16 6  CREATININE 1.87* 1.01 0.85 0.74  CALCIUM 8.7* 8.2* 8.0* 8.3*   GFR: Estimated Creatinine Clearance: 153.6 mL/min (by C-G formula based on Cr of 0.74). Liver Function Tests:  Recent Labs Lab 01/18/16 1334 01/19/16 0450  AST 54* 39  ALT 28 22  ALKPHOS 53 40  BILITOT 1.6* 1.0  PROT 8.4* 6.8  ALBUMIN 4.7 3.9    Recent Labs Lab 01/18/16 1334  LIPASE 49   No results for input(s): AMMONIA in the last 168 hours. Coagulation Profile: No results for input(s): INR, PROTIME in the last 168 hours. Cardiac Enzymes: No results for input(s): CKTOTAL, CKMB, CKMBINDEX, TROPONINI in the last 168 hours. BNP (last 3 results) No results for input(s): PROBNP in the last 8760 hours. HbA1C: No results for input(s): HGBA1C in the last 72 hours.  CBG: No results for input(s): GLUCAP in the last 168 hours. Lipid Profile: No results for input(s): CHOL, HDL, LDLCALC, TRIG, CHOLHDL, LDLDIRECT in the last 72 hours. Thyroid Function Tests: No results for input(s): TSH, T4TOTAL, FREET4, T3FREE, THYROIDAB in the last 72 hours. Anemia Panel: No results for input(s): VITAMINB12, FOLATE, FERRITIN, TIBC, IRON, RETICCTPCT in the last 72 hours. Sepsis Labs: No results for input(s): PROCALCITON, LATICACIDVEN in the last 168 hours.  Recent Results (from the past 240 hour(s))  MRSA  PCR Screening     Status: None   Collection Time: 01/18/16  7:23 PM  Result Value Ref Range Status   MRSA by PCR NEGATIVE NEGATIVE Final    Comment:        The GeneXpert MRSA Assay (FDA approved for NASAL specimens only), is one component of a comprehensive MRSA colonization surveillance program. It is not intended to diagnose MRSA infection nor to guide or monitor treatment for MRSA infections.          Radiology Studies: No results found.      Scheduled Meds: . cefTRIAXone (ROCEPHIN)  IV  1 g Intravenous Q24H  . famotidine (PEPCID) IV  20 mg Intravenous Q12H  . feeding supplement (ENSURE ENLIVE)  237 mL Oral BID BM  . heparin  5,000 Units Subcutaneous Q8H  . sodium chloride flush  3 mL Intravenous Q12H   Continuous Infusions: . dextrose 5 % and 0.9% NaCl 75 mL/hr at 01/22/16 0944     LOS: 4 days      Adon Gehlhausen Annett Gula, MD [facility] Pager [phone]  If 7PM-7AM, please contact night-coverage [email or web address] Password [email or web address] 01/22/2016, 11:19 AM

## 2016-01-22 NOTE — Progress Notes (Signed)
Discharge instruction given to pt, verbalized understanding. Left the unit in stable condition [place] [facility].

## 2016-01-25 ENCOUNTER — Encounter (HOSPITAL_COMMUNITY): Payer: Self-pay | Admitting: Emergency Medicine

## 2016-01-25 ENCOUNTER — Emergency Department (HOSPITAL_COMMUNITY)
Admission: EM | Admit: 2016-01-25 | Discharge: 2016-01-25 | Disposition: A | Discharge status: Home / Self Care | Attending: Emergency Medicine | Admitting: Emergency Medicine

## 2016-01-25 DIAGNOSIS — Z79899 Other long term (current) drug therapy: Secondary | ICD-10-CM | POA: Diagnosis not present

## 2016-01-25 DIAGNOSIS — F12188 Cannabis abuse with other cannabis-induced disorder: Secondary | ICD-10-CM | POA: Insufficient documentation

## 2016-01-25 DIAGNOSIS — R112 Nausea with vomiting, unspecified: Secondary | ICD-10-CM

## 2016-01-25 DIAGNOSIS — F1990 Other psychoactive substance use, unspecified, uncomplicated: Secondary | ICD-10-CM | POA: Insufficient documentation

## 2016-01-25 DIAGNOSIS — F1910 Other psychoactive substance abuse, uncomplicated: Secondary | ICD-10-CM

## 2016-01-25 DIAGNOSIS — F12988 Cannabis use, unspecified with other cannabis-induced disorder: Secondary | ICD-10-CM

## 2016-01-25 DIAGNOSIS — F17200 Nicotine dependence, unspecified, uncomplicated: Secondary | ICD-10-CM | POA: Insufficient documentation

## 2016-01-25 DIAGNOSIS — Z8719 Personal history of other diseases of the digestive system: Secondary | ICD-10-CM | POA: Insufficient documentation

## 2016-01-25 DIAGNOSIS — F1290 Cannabis use, unspecified, uncomplicated: Secondary | ICD-10-CM

## 2016-01-25 DIAGNOSIS — R1084 Generalized abdominal pain: Secondary | ICD-10-CM

## 2016-01-25 DIAGNOSIS — R1116 Cannabis hyperemesis syndrome: Secondary | ICD-10-CM

## 2016-01-25 LAB — COMPREHENSIVE METABOLIC PANEL
ALK PHOS: 57 U/L (ref 38–126)
ALT: 75 U/L — AB (ref 17–63)
ANION GAP: 14 (ref 5–15)
AST: 48 U/L — ABNORMAL HIGH (ref 15–41)
Albumin: 4.9 g/dL (ref 3.5–5.0)
BILIRUBIN TOTAL: 0.7 mg/dL (ref 0.3–1.2)
BUN: 11 mg/dL (ref 6–20)
CALCIUM: 9.8 mg/dL (ref 8.9–10.3)
CO2: 24 mmol/L (ref 22–32)
CREATININE: 1.37 mg/dL — AB (ref 0.61–1.24)
Chloride: 102 mmol/L (ref 101–111)
Glucose, Bld: 140 mg/dL — ABNORMAL HIGH (ref 65–99)
Potassium: 3.8 mmol/L (ref 3.5–5.1)
SODIUM: 140 mmol/L (ref 135–145)
TOTAL PROTEIN: 7.9 g/dL (ref 6.5–8.1)

## 2016-01-25 LAB — URINALYSIS, ROUTINE W REFLEX MICROSCOPIC
Bilirubin Urine: NEGATIVE
GLUCOSE, UA: NEGATIVE mg/dL
Hgb urine dipstick: NEGATIVE
KETONES UR: 40 mg/dL — AB
LEUKOCYTES UA: NEGATIVE
NITRITE: NEGATIVE
PH: 8.5 — AB (ref 5.0–8.0)
Protein, ur: 30 mg/dL — AB
SPECIFIC GRAVITY, URINE: 1.019 (ref 1.005–1.030)

## 2016-01-25 LAB — CBC WITH DIFFERENTIAL/PLATELET
BASOS PCT: 0 %
Basophils Absolute: 0 10*3/uL (ref 0.0–0.1)
EOS ABS: 0 10*3/uL (ref 0.0–0.7)
EOS PCT: 0 %
HCT: 45.1 % (ref 39.0–52.0)
HEMOGLOBIN: 15.6 g/dL (ref 13.0–17.0)
Lymphocytes Relative: 6 %
Lymphs Abs: 1.3 10*3/uL (ref 0.7–4.0)
MCH: 28.9 pg (ref 26.0–34.0)
MCHC: 34.6 g/dL (ref 30.0–36.0)
MCV: 83.5 fL (ref 78.0–100.0)
Monocytes Absolute: 1.1 10*3/uL — ABNORMAL HIGH (ref 0.1–1.0)
Monocytes Relative: 5 %
NEUTROS PCT: 89 %
Neutro Abs: 18.7 10*3/uL — ABNORMAL HIGH (ref 1.7–7.7)
PLATELETS: 412 10*3/uL — AB (ref 150–400)
RBC: 5.4 MIL/uL (ref 4.22–5.81)
RDW: 12.4 % (ref 11.5–15.5)
WBC: 21.2 10*3/uL — AB (ref 4.0–10.5)

## 2016-01-25 LAB — RAPID URINE DRUG SCREEN, HOSP PERFORMED
AMPHETAMINES: NOT DETECTED
Barbiturates: NOT DETECTED
Benzodiazepines: NOT DETECTED
COCAINE: NOT DETECTED
OPIATES: POSITIVE — AB
TETRAHYDROCANNABINOL: POSITIVE — AB

## 2016-01-25 LAB — LIPASE, BLOOD: LIPASE: 93 U/L — AB (ref 11–51)

## 2016-01-25 LAB — URINE MICROSCOPIC-ADD ON

## 2016-01-25 MED ORDER — PROMETHAZINE HCL 25 MG RE SUPP: 25.0000 mg | Freq: Four times a day (QID) | RECTAL | Status: AC | PRN

## 2016-01-25 MED ORDER — HALOPERIDOL LACTATE 5 MG/ML IJ SOLN
2.0000 mg | Freq: Once | INTRAMUSCULAR | Status: AC
  Administered 2016-01-25: 2 mg via INTRAVENOUS
  Filled 2016-01-25: qty 1

## 2016-01-25 MED ORDER — SODIUM CHLORIDE 0.9 % IV BOLUS (SEPSIS)
2000.0000 mL | Freq: Once | INTRAVENOUS | Status: AC
  Administered 2016-01-25: 2000 mL via INTRAVENOUS

## 2016-01-25 MED ORDER — MORPHINE SULFATE (PF) 4 MG/ML IV SOLN
4.0000 mg | Freq: Once | INTRAVENOUS | Status: AC
  Administered 2016-01-25: 4 mg via INTRAVENOUS
  Filled 2016-01-25: qty 1

## 2016-01-25 MED ORDER — LORAZEPAM 2 MG/ML IJ SOLN
1.0000 mg | Freq: Once | INTRAMUSCULAR | Status: AC
  Administered 2016-01-25: 1 mg via INTRAVENOUS
  Filled 2016-01-25: qty 1

## 2016-01-25 MED ORDER — FAMOTIDINE IN NACL 20-0.9 MG/50ML-% IV SOLN
20.0000 mg | Freq: Once | INTRAVENOUS | Status: AC
  Administered 2016-01-25: 20 mg via INTRAVENOUS
  Filled 2016-01-25: qty 50

## 2016-01-25 MED ORDER — GI COCKTAIL ~~LOC~~
30.0000 mL | Freq: Once | ORAL | Status: AC
  Administered 2016-01-25: 30 mL via ORAL
  Filled 2016-01-25: qty 30

## 2016-01-25 NOTE — ED Notes (Signed)
Bed: WA03 Expected date:  Expected time:  Means of arrival:  Comments: EMS- emesis/in custody

## 2016-01-25 NOTE — ED Notes (Signed)
Per EMS, patient received medication at am which helped him feel better, patient drank orange juice at breakfast and patient started getting LLQ abdominal pain with emesis after intake. Patient has been vomiting since. Patient received 4 mg zofran IV. Patient was admitted here recently for 5 days for stomach ulcers. Similar presentation per officers. Patient is in custody.

## 2016-01-25 NOTE — ED Notes (Signed)
Water at bedside  

## 2016-01-25 NOTE — Discharge Instructions (Signed)
You were seen in the emergency room today for evaluation of abdominal pain, nausea, and vomiting. We gave you IV fluids. I will give you a prescription for phenergan to use as needed for nausea and vomiting. It is a suppository so you do not have to worry about keeping pills down. STOP using marijuana as this is what is most likely causing your chronic pain and nausea/vomiting.

## 2016-01-25 NOTE — ED Provider Notes (Signed)
CSN: [id number]     Arrival date & time 01/25/16  1719 History   First MD Initiated Contact with Patient 01/25/16 1734     Chief Complaint  Patient presents with  . Abdominal Pain  . Emesis    HPI  Miguel Roman is an 25 y.o. male with history of hyperemsis syndrome, PUD, and gastritis who presents to the ED (currently incarcerated) for evaluation of N/V and abdominal pain. He states his symptoms started this morning after drinking orange juice at breakfast. He describes sharp, severe, cramping diffuse abdominal pain with associated nausea. He states he has been dry heaving and is not able to produce any emesis. He was given 4mg  Zofran IV en route with no relief. Denies chest pain or SOB. Denies fever, chills, urinary symptoms. Denies blood in his vomit or his stools. Has admitted to the hospital earlier this month for intractable nausea and vomiting, found to have pneumomediastinum which resolved during his hospitalization. He had negative workup for esophageal injury. He has been seen in the ED many times before for similar symptoms. Last CT abd/pelvis was 11/2015 and was unremarkable. Pt states last marijuana use was over two weeks ago. Denies recent alcohol or illicit drug use.   Past Medical History  Diagnosis Date  . Peptic ulcer   . Gastritis    Past Surgical History  Procedure Laterality Date  . Cholecystectomy     No family history on file. Social History  Substance Use Topics  . Smoking status: Current Every Day Smoker -- 1.00 packs/day  . Smokeless tobacco: None  . Alcohol Use: Yes    Review of Systems  All other systems reviewed and are negative.     Allergies  Haldol; Thorazine; Toradol; Tramadol; Trazodone and nefazodone; and Penicillins  Home Medications   Prior to Admission medications   Medication Sig Start Date End Date Taking? Authorizing Provider  ondansetron (ZOFRAN) 4 MG/5ML solution Take 5 mLs (4 mg total) by mouth every 4 (four) hours as needed for  nausea or vomiting. 01/20/16  Yes Mauricio Annett Gulaaniel Arrien, MD  pantoprazole (PROTONIX) 20 MG tablet Take 1 tablet (20 mg total) by mouth daily. 12/01/15  Yes Tiffany Neva SeatGreene, PA-C  cephALEXin (KEFLEX) 500 MG capsule Take 1 capsule (500 mg total) by mouth 4 (four) times daily. Patient not taking: Reported on 01/18/2016 01/13/16   Lavera Guiseana Duo Liu, MD   BP 117/102 mmHg  Pulse 130  Resp 18  Ht 6' (1.829 m)  Wt 83.915 kg  BMI 25.08 kg/m2  SpO2 100% Physical Exam  Constitutional: He is oriented to person, place, and time.  Writhing, moaning in bed. Occasionally dry heaves. No emesis.   HENT:  Right Ear: External ear normal.  Left Ear: External ear normal.  Nose: Nose normal.  Mouth/Throat: Oropharynx is clear and moist. No oropharyngeal exudate.  Eyes: Conjunctivae and EOM are normal. Pupils are equal, round, and reactive to light.  Neck: Normal range of motion. Neck supple.  Cardiovascular: Normal rate, regular rhythm, normal heart sounds and intact distal pulses.   Pulmonary/Chest: Effort normal and breath sounds normal. No respiratory distress. He has no wheezes.  Abdominal: Soft. Bowel sounds are normal. He exhibits no distension. There is tenderness. There is no rebound and no guarding.  Abdomen diffusely tender without guarding  Musculoskeletal: He exhibits no edema.  Neurological: He is alert and oriented to person, place, and time. No cranial nerve deficit.  Skin: Skin is warm and dry.  Psychiatric: His mood appears anxious.  He is agitated.  Nursing note and vitals reviewed.   ED Course  Procedures (including critical care time) Labs Review Labs Reviewed  COMPREHENSIVE METABOLIC PANEL - Abnormal; Notable for the following:    Glucose, Bld 140 (*)    Creatinine, Ser 1.37 (*)    AST 48 (*)    ALT 75 (*)    All other components within normal limits  LIPASE, BLOOD - Abnormal; Notable for the following:    Lipase 93 (*)    All other components within normal limits  CBC WITH  DIFFERENTIAL/PLATELET - Abnormal; Notable for the following:    WBC 21.2 (*)    Platelets 412 (*)    Neutro Abs 18.7 (*)    Monocytes Absolute 1.1 (*)    All other components within normal limits  URINE RAPID DRUG SCREEN, HOSP PERFORMED - Abnormal; Notable for the following:    Opiates POSITIVE (*)    Tetrahydrocannabinol POSITIVE (*)    All other components within normal limits  URINALYSIS, ROUTINE W REFLEX MICROSCOPIC (NOT AT [facility]) - Abnormal; Notable for the following:    APPearance CLOUDY (*)    pH 8.5 (*)    Ketones, ur 40 (*)    Protein, ur 30 (*)    All other components within normal limits  URINE MICROSCOPIC-ADD ON - Abnormal; Notable for the following:    Squamous Epithelial / LPF 0-5 (*)    Bacteria, UA RARE (*)    Casts HYALINE CASTS (*)    All other components within normal limits    Imaging Review No results found. I have personally reviewed and evaluated these images and lab results as part of my medical decision-making.   EKG Interpretation None      MDM   Final diagnoses:  Cannabinoid hyperemesis syndrome (HCC)  Generalized abdominal pain  Non-intractable vomiting with nausea, vomiting of unspecified type  Polysubstance abuse    Pt is an 25 y.o. male, currently incarcerated, who presents with generalized abdominal pain with n/v since this morning after drinking some orange juice. He has had several presentations recently for similar symptoms. Most recently he was admitted earlier this morning with intractable nausea vomiting, AKI, hypokalemia, and pneumomediastinum. Pneumomediastinum resolved during hospitalization and no esophageal injury during workup. Here today his nausea and vomiting is well controlled. He was given one dose of morphine but I discussed no further narcotics. His labs do reveal a WBC of 21.2, Cr of 1.37. UA with ketones and protein. He was given 2L NS here. Last CT abd/pelvis two months ago was unremarkable. He denies recent marijuana use  but UDS is positive for THC. He has been diagnosed with cannabis hyperemesis syndrome in the past which is likely the cause of his symptoms today. I again reiterated the importance of marijuana cessation. He is now tolerating PO in the ED. His tachycardia has resolve. Vitals otherwise unremarkable and stable. Rx given for phenergan suppositories. Repeat abdominal exam benign. Will be discharged back into the care of PD.     Carlene Coria, PA-C 01/26/16 0118  Mancel Bale, MD 01/26/16 [id number]

## 2016-07-09 IMAGING — CR DG ABDOMEN ACUTE W/ 1V CHEST
3 series · 3 of 3 positions shown · non-contrast
Comparison: 11/24/2015 chest radiograph and CT abdomen/pelvis.

CLINICAL DATA: Abdominal pain, nausea and vomiting. History of
gastritis and peptic ulcer disease.

EXAM:
DG ABDOMEN ACUTE W/ 1V CHEST

[x chest ap]
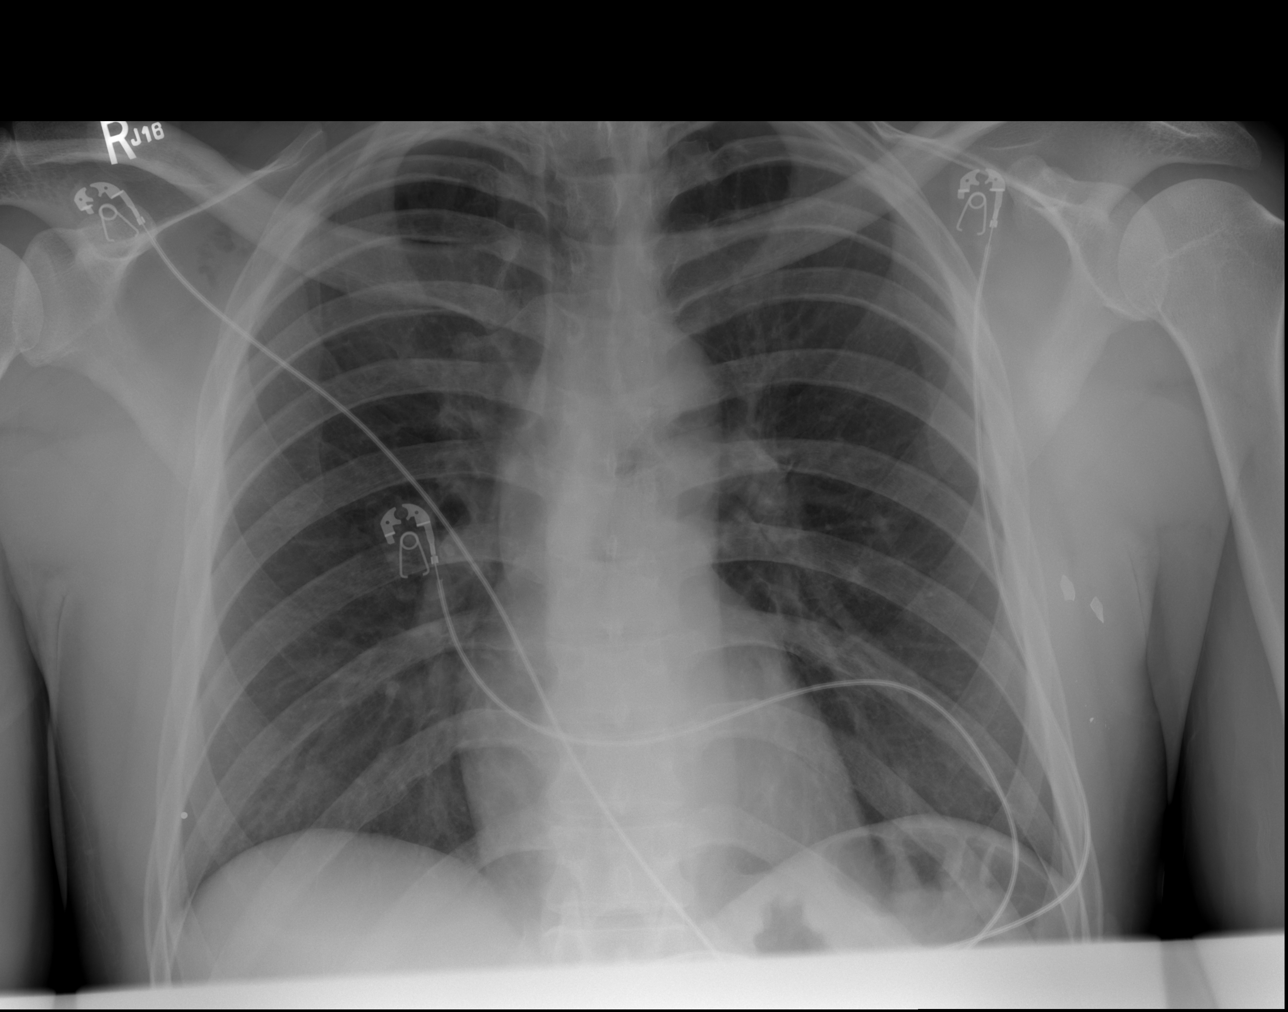

[x abdomen supine]
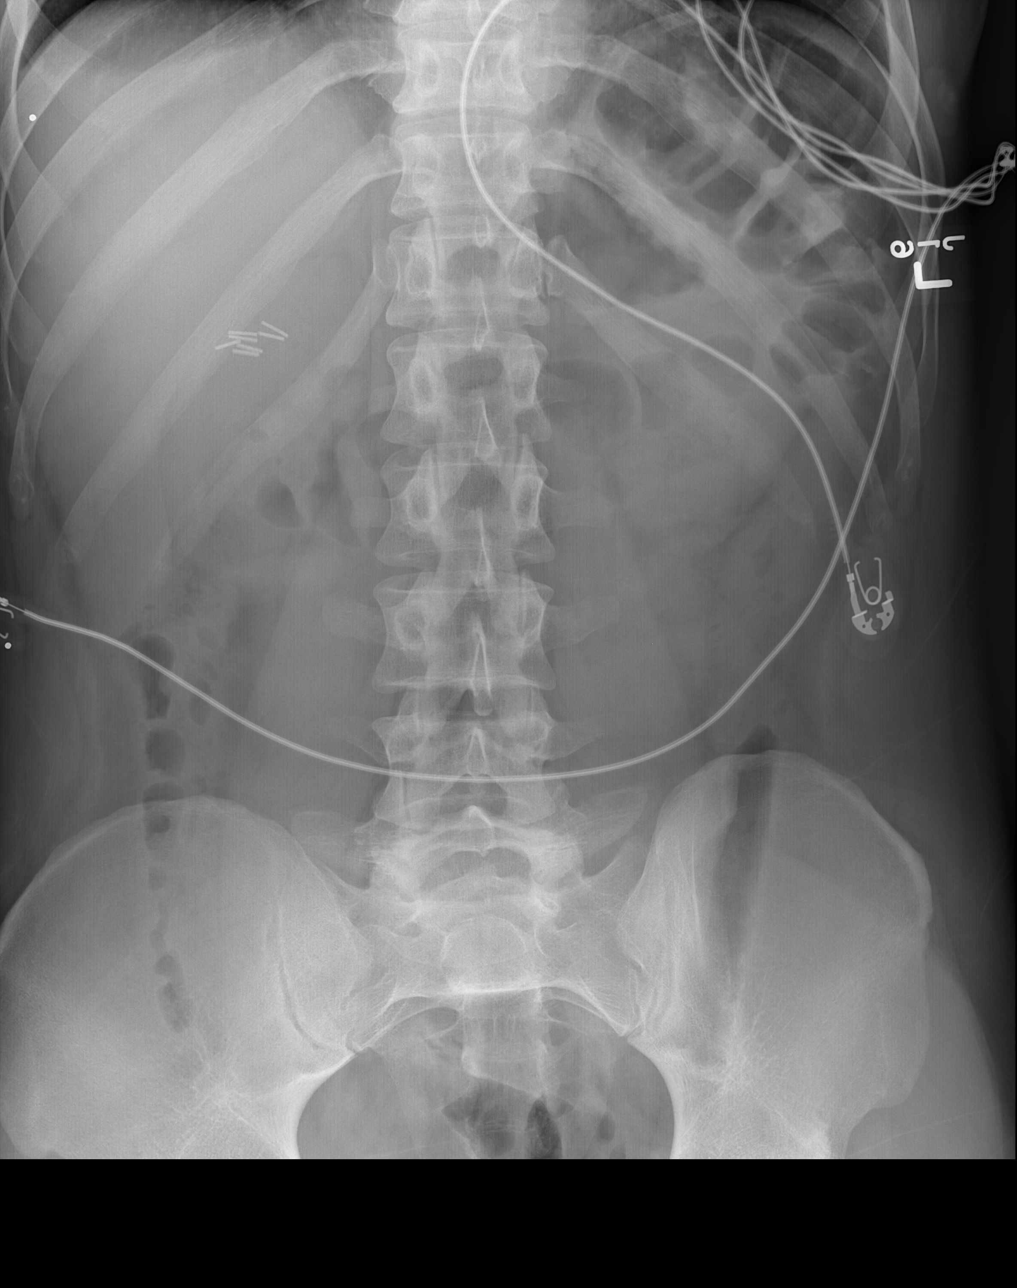

[w abdomen decub]
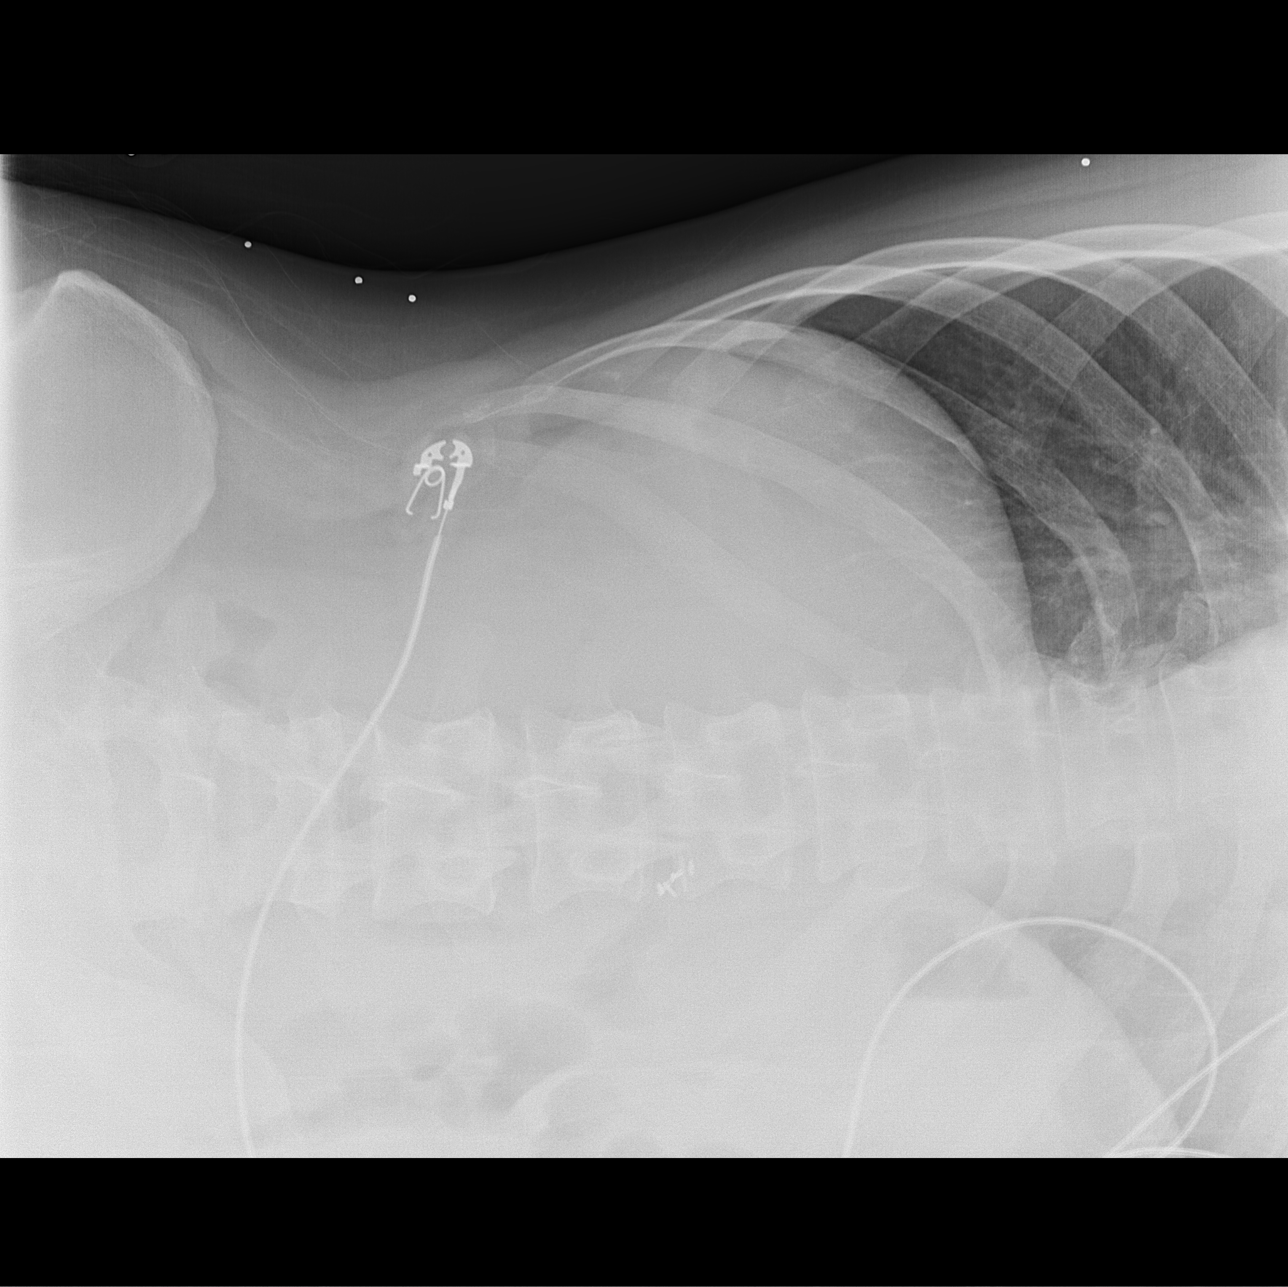

[3 of 3 positions shown; findings below may reference images not displayed]

FINDINGS: There is subcutaneous emphysema inferior to the right mid clavicular
shaft. There is streaky subcutaneous emphysema in the medial lower
neck bilaterally. Stable metallic densities overlying the lateral
chest wall bilaterally. Stable cardiomediastinal silhouette with
normal heart size. Streaky lucencies in the left hilum may indicate
mild pulmonary interstitial emphysema. No pneumothorax. No pleural
effusion. Lungs appear clear, with no acute consolidative airspace
disease and no pulmonary edema. No dilated small bowel loops or
air-fluid levels. Minimal colonic stool. No evidence of pneumatosis,
pneumoperitoneum or pathologic soft tissue calcification.
Cholecystectomy clips are seen in the right upper quadrant of the
abdomen.
IMPRESSION: 1. Subcutaneous emphysema is seen in the medial lower neck
bilaterally and inferior to the right mid clavicle. Possible mild
pulmonary interstitial emphysema in the left hilum. Findings are
nonspecific, with esophageal injury not excluded. Consider
correlation with chest CT with IV contrast.
2. Nonobstructive bowel gas pattern. No evidence of
pneumoperitoneum.
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
